# Patient Record
Sex: Female | Born: 1980 | Race: Black or African American | Hispanic: No | Marital: Married | Smoking: Never smoker
Health system: Southern US, Community
[De-identification: ages and names within clinical notes are randomized; demographics above are authoritative.]

## PROBLEM LIST (undated history)

## (undated) ENCOUNTER — Emergency Department (HOSPITAL_COMMUNITY): Payer: Federal, State, Local not specified - PPO

## (undated) DIAGNOSIS — R011 Cardiac murmur, unspecified: Secondary | ICD-10-CM

## (undated) DIAGNOSIS — N83209 Unspecified ovarian cyst, unspecified side: Secondary | ICD-10-CM

## (undated) DIAGNOSIS — N970 Female infertility associated with anovulation: Secondary | ICD-10-CM

## (undated) DIAGNOSIS — D571 Sickle-cell disease without crisis: Secondary | ICD-10-CM

## (undated) DIAGNOSIS — R17 Unspecified jaundice: Secondary | ICD-10-CM

## (undated) DIAGNOSIS — D649 Anemia, unspecified: Secondary | ICD-10-CM

## (undated) HISTORY — DX: Sickle-cell disease without crisis: D57.1

## (undated) HISTORY — DX: Anemia, unspecified: D64.9

## (undated) HISTORY — DX: Unspecified ovarian cyst, unspecified side: N83.209

## (undated) HISTORY — DX: Female infertility associated with anovulation: N97.0

## (undated) HISTORY — DX: Cardiac murmur, unspecified: R01.1

## (undated) HISTORY — DX: Unspecified jaundice: R17

---
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
## 2005-03-05 ENCOUNTER — Emergency Department (HOSPITAL_COMMUNITY): Admission: EM | Admit: 2005-03-05 | Discharge: 2005-03-05 | Payer: Self-pay | Admitting: Emergency Medicine

## 2005-03-07 ENCOUNTER — Inpatient Hospital Stay (HOSPITAL_COMMUNITY): Admission: EM | Admit: 2005-03-07 | Discharge: 2005-03-23 | Payer: Self-pay | Admitting: Emergency Medicine

## 2005-03-09 ENCOUNTER — Ambulatory Visit: Payer: Self-pay | Admitting: Oncology

## 2005-03-17 ENCOUNTER — Ambulatory Visit: Payer: Self-pay | Admitting: Cardiology

## 2005-03-17 ENCOUNTER — Encounter: Payer: Self-pay | Admitting: Cardiology

## 2005-03-20 ENCOUNTER — Ambulatory Visit: Payer: Self-pay | Admitting: Oncology

## 2005-03-22 ENCOUNTER — Encounter: Payer: Self-pay | Admitting: *Deleted

## 2005-05-26 ENCOUNTER — Ambulatory Visit: Payer: Self-pay | Admitting: Oncology

## 2006-02-06 ENCOUNTER — Emergency Department (HOSPITAL_COMMUNITY): Admission: EM | Admit: 2006-02-06 | Discharge: 2006-02-06 | Payer: Self-pay | Admitting: Emergency Medicine

## 2006-02-28 ENCOUNTER — Emergency Department (HOSPITAL_COMMUNITY): Admission: EM | Admit: 2006-02-28 | Discharge: 2006-02-28 | Payer: Self-pay | Admitting: Emergency Medicine

## 2006-04-14 ENCOUNTER — Emergency Department (HOSPITAL_COMMUNITY): Admission: EM | Admit: 2006-04-14 | Discharge: 2006-04-14 | Payer: Self-pay | Admitting: Emergency Medicine

## 2007-02-06 IMAGING — CR DG CHEST 2V
2 series · 2 of 2 positions shown · non-contrast
Comparison: none

CLINICAL DATA: Shortness of breath and cough.  Sickle cell.
 CHEST ? 2 VIEW:

[w chest pa]
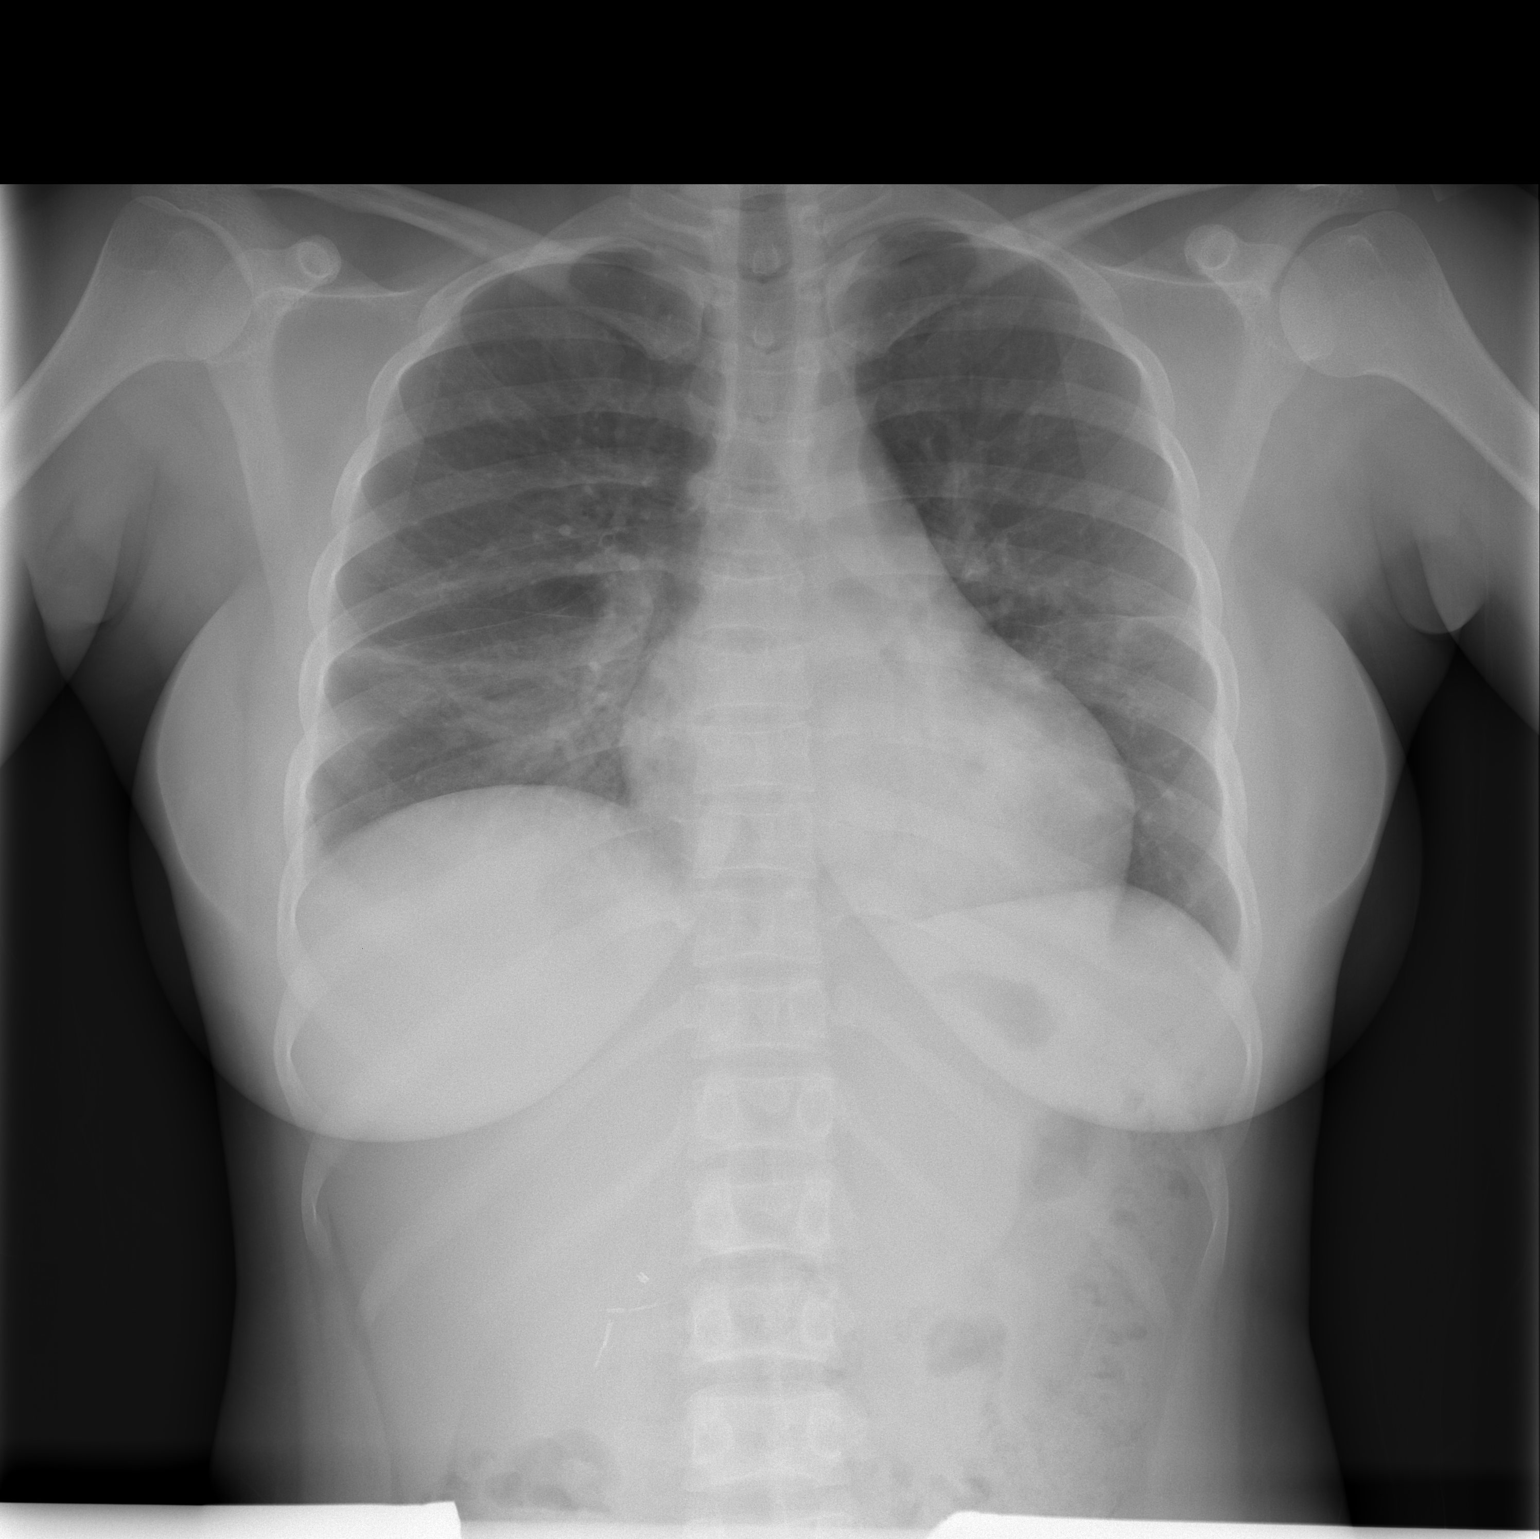

[w chest lat]
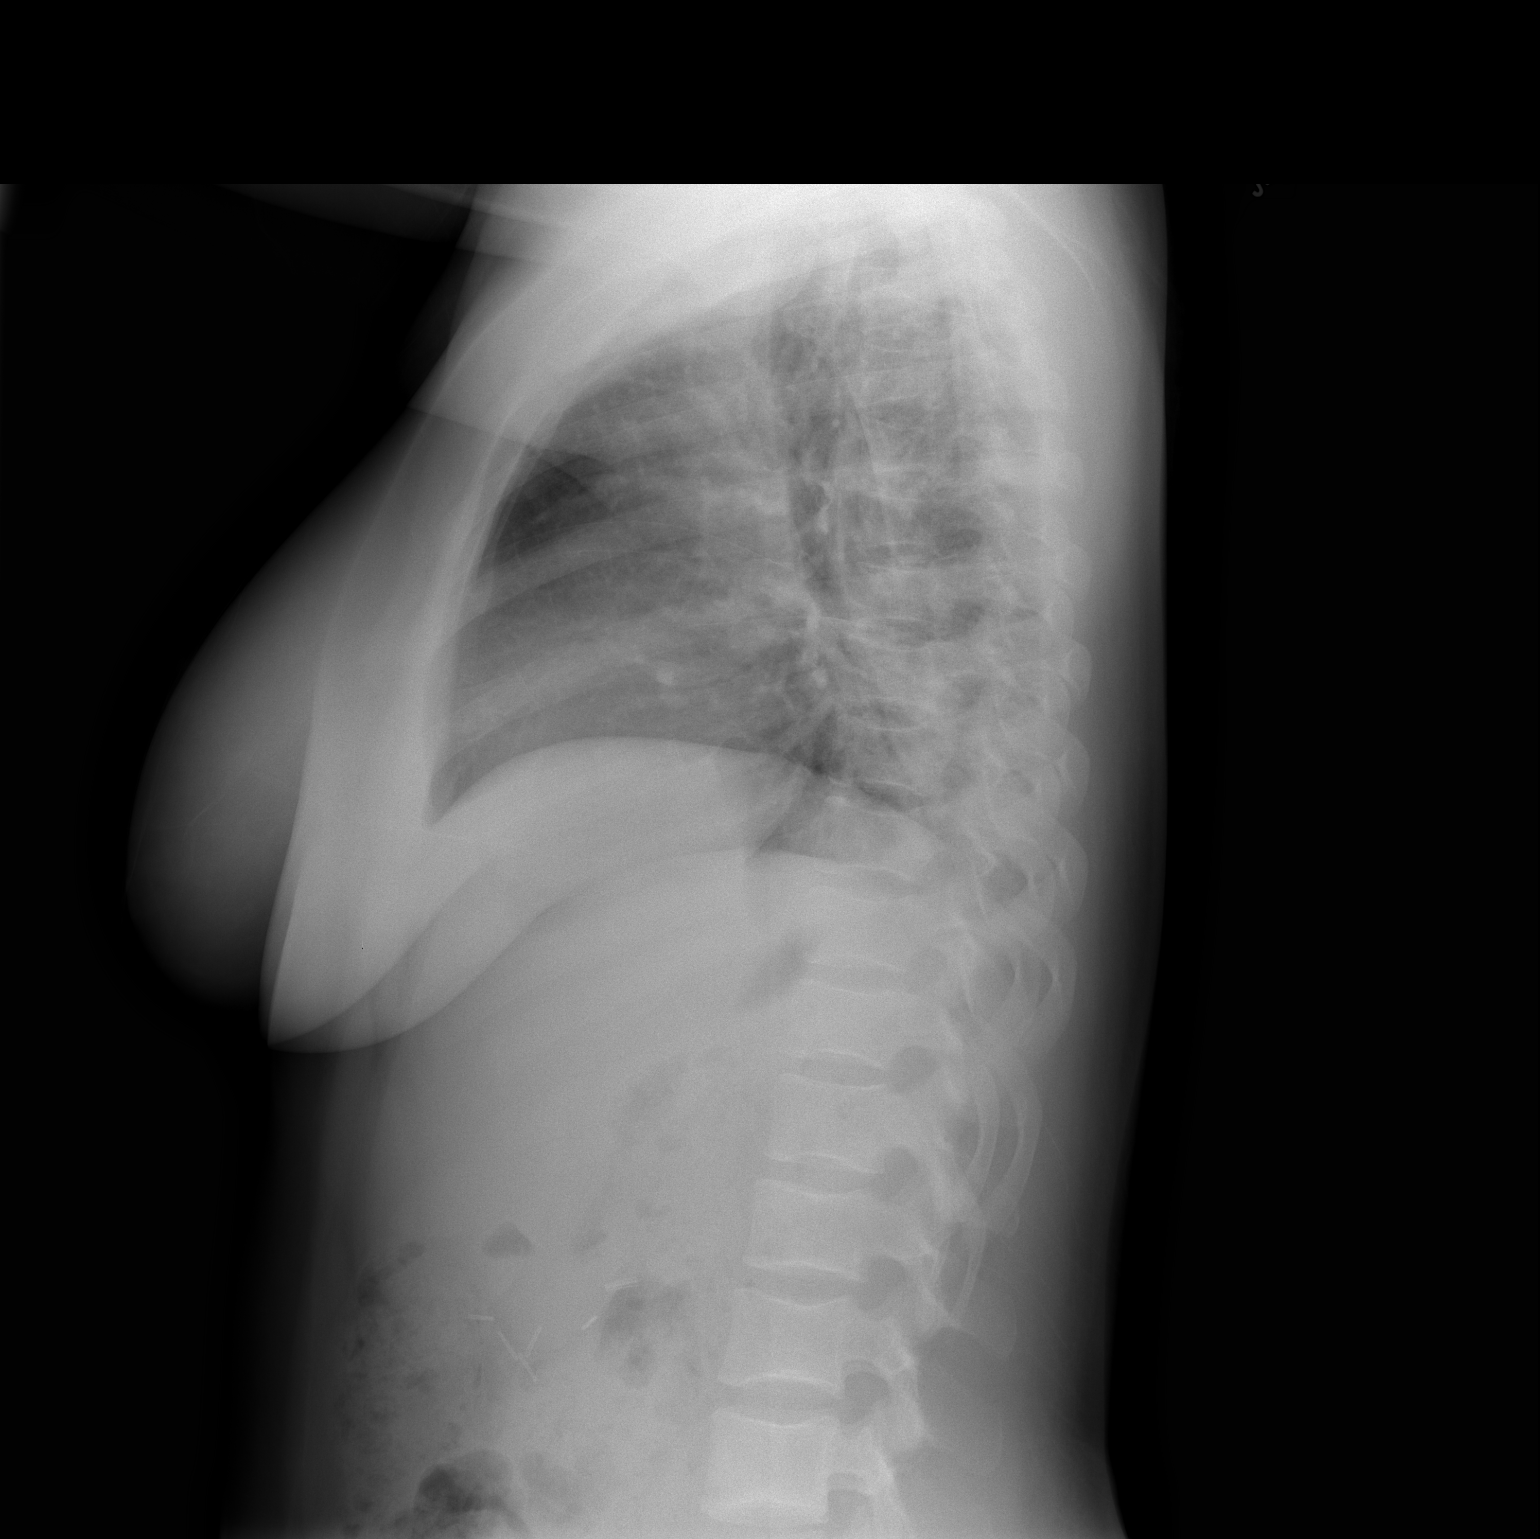

[2 of 2 positions shown; findings below may reference images not displayed]

FINDINGS: PA and lateral views reveal the heart size to be prominent particularly in the region of the left ventricle.  Areas of infiltration noted in the right lower lobe.  As well as probable left lower lobe.  
 There is pneumonia in the right lower lobe and left lower lobe.
IMPRESSION:

## 2007-02-06 IMAGING — CT CT ANGIO CHEST
2 of 4 series · 19 of 36 positions shown · IV contrast (omnipaque)
Comparison: None.

CLINICAL DATA: Short of breath/cough.
 CT ANGIOGRAPHY OF CHEST:
TECHNIQUE: Multi-detector CT imaging of the chest was performed before and during bolus injection of intravenous contrast.  Multi-planar CT image reconstructions were generated to evaluate the vascular anatomy.
 Contrast:  80 cc Omnipaque 300.

[Series 6: pe 1.0 b20f st · axial · 0.60mm/px · z∈[-205,-29]mm · 16 of 196 slices shown]
[im 10/196  lung]
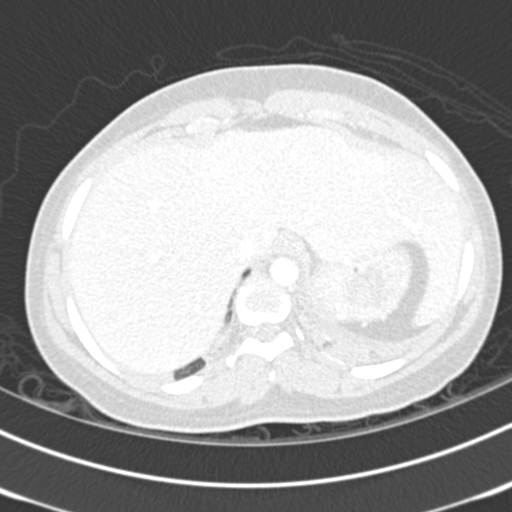
[im 20/196  mediastinal]
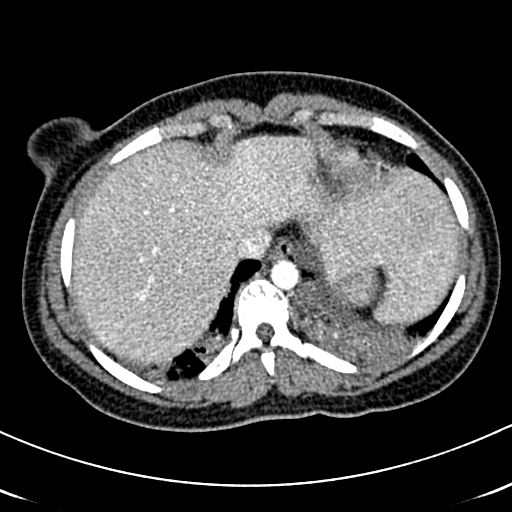
[im 30/196  lung]
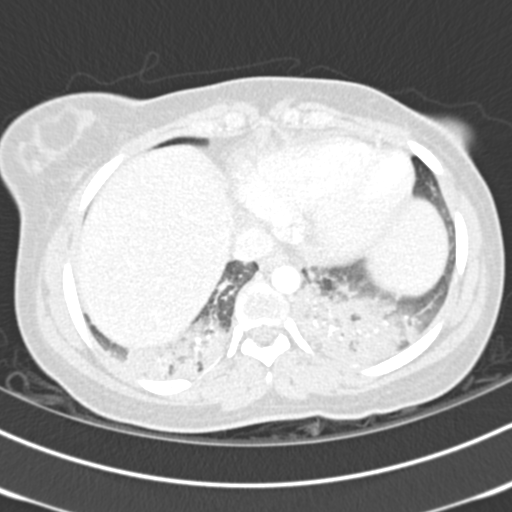
[im 49/196  mediastinal]
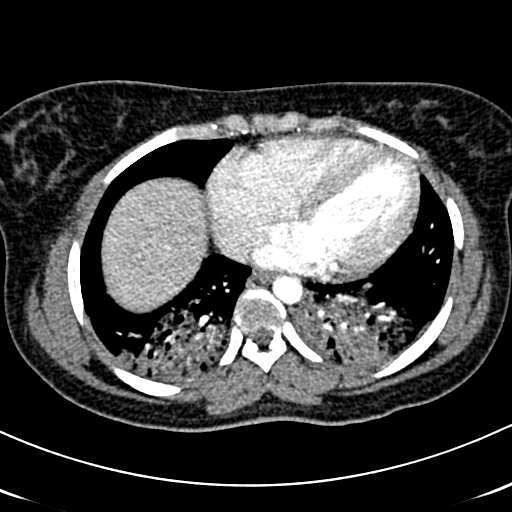
[im 59/196  lung]
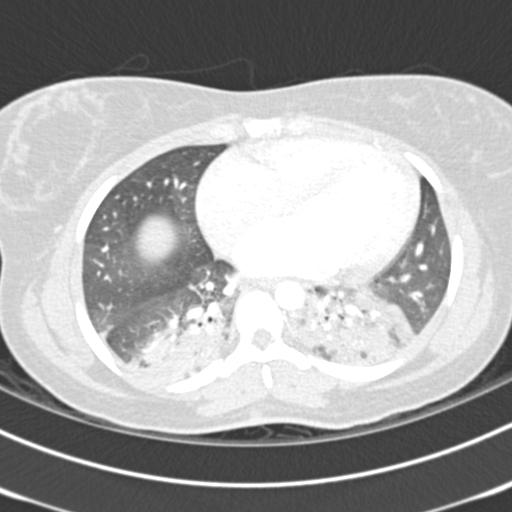
[im 69/196  mediastinal]
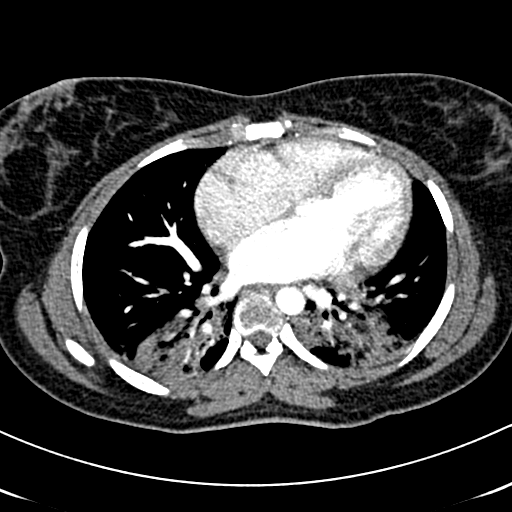
[im 79/196  lung]
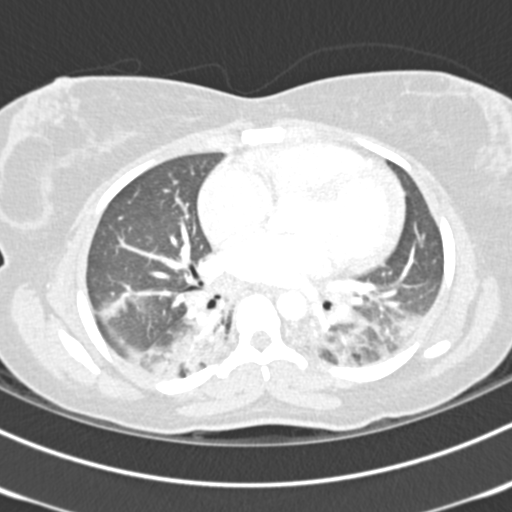
[im 88/196  mediastinal]
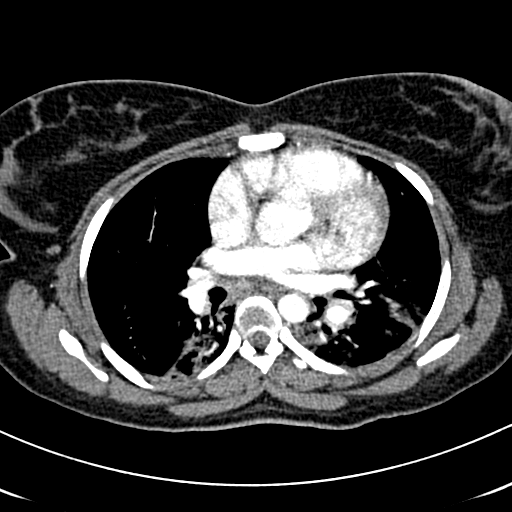
[im 108/196  lung]
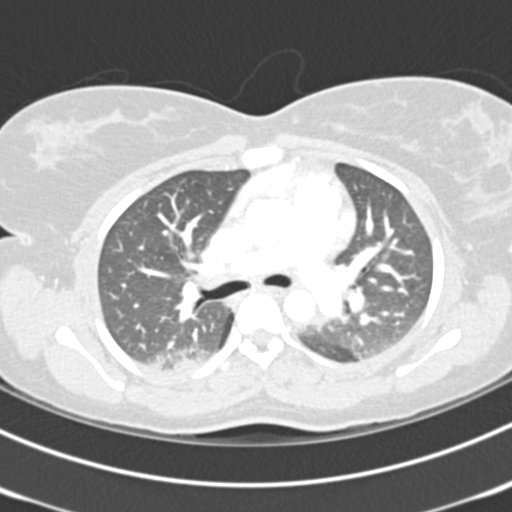
[im 118/196  mediastinal]
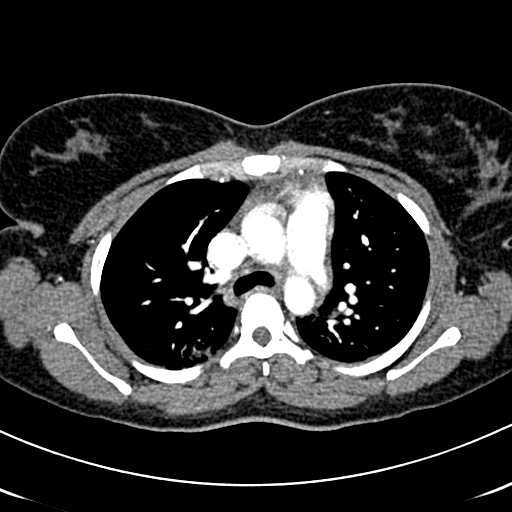
[im 127/196  lung]
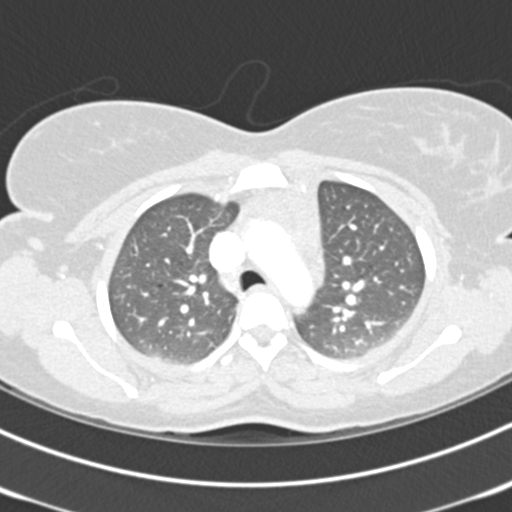
[im 137/196  mediastinal]
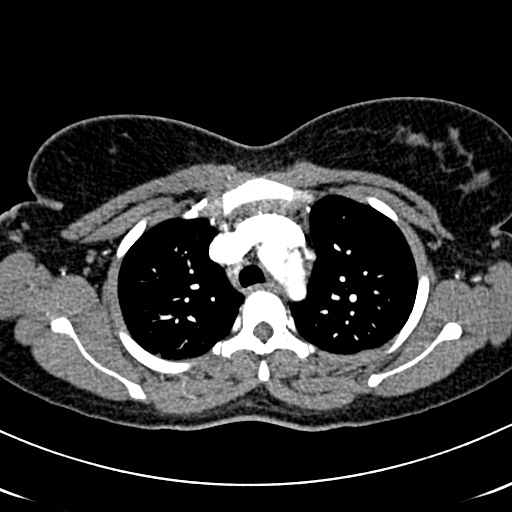
[im 147/196  lung]
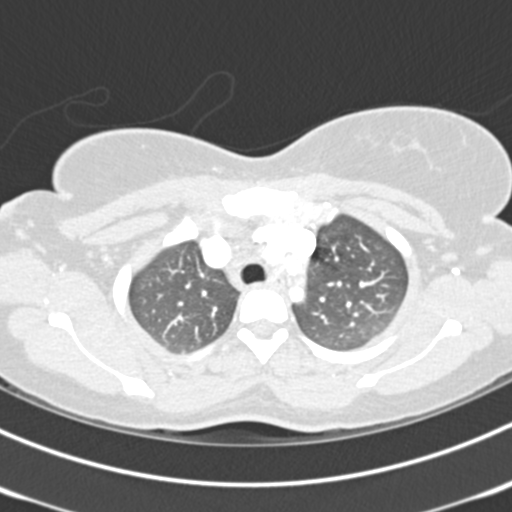
[im 166/196  mediastinal]
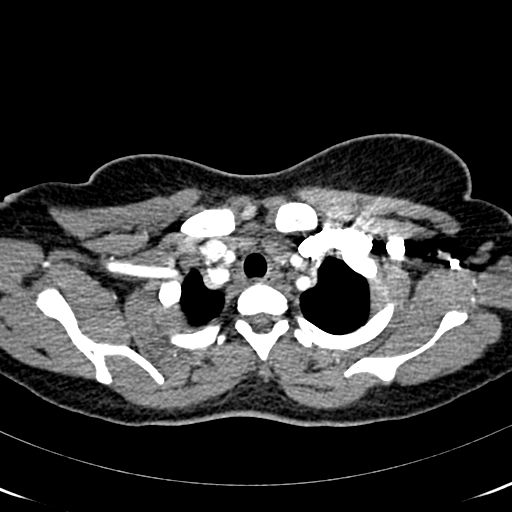
[im 176/196  lung]
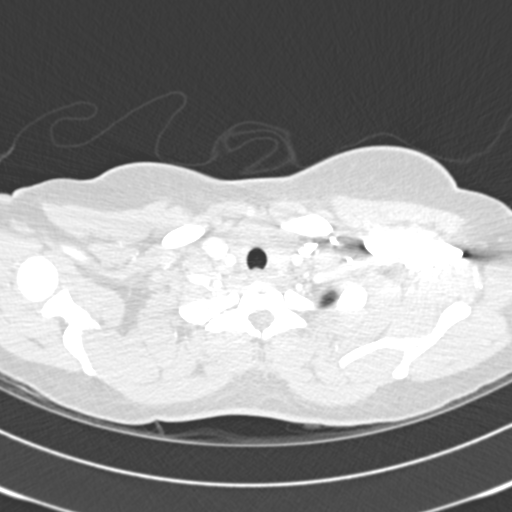
[im 186/196  mediastinal]
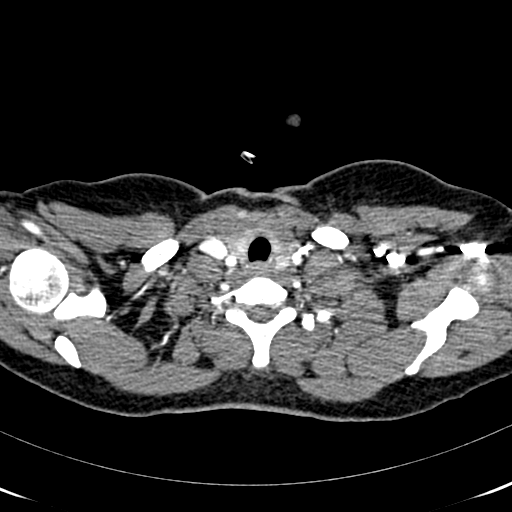

[Series 602: coronal · coronal · 0.60mm/px · 3 of 39 slices shown]
[im 8/39  mediastinal]
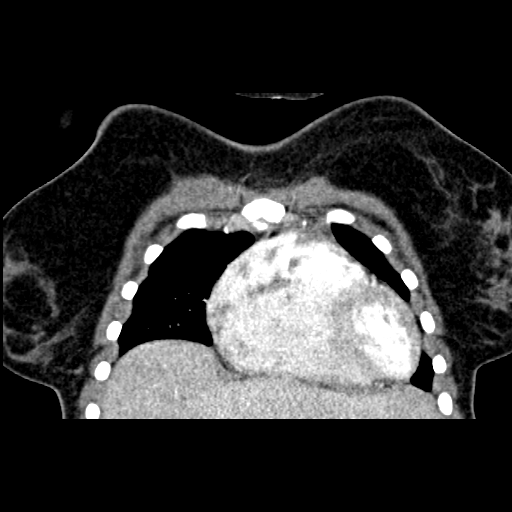
[im 16/39  mediastinal]
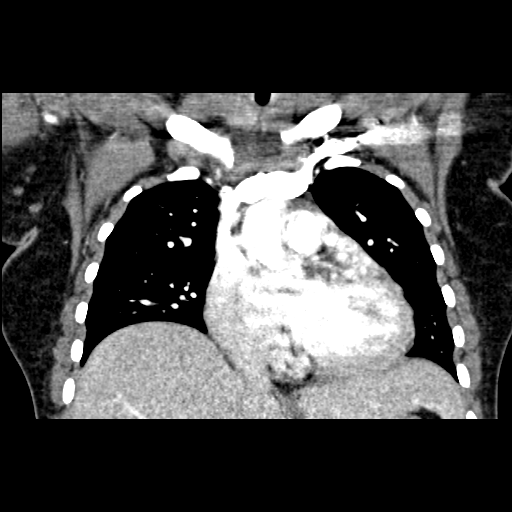
[im 23/39  mediastinal]
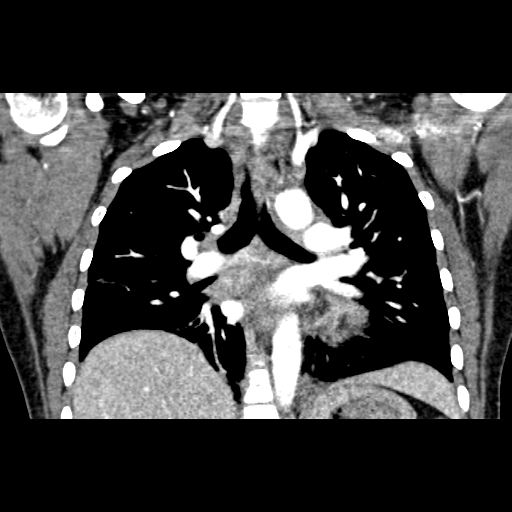

[19 of 36 positions shown; findings below may reference images not displayed]

FINDINGS: In three planes, no pulmonary emboli are identified.  The bilateral lower lobe vessels are somewhat attenuated by extensive airspace infiltrates.  This compromises the study to some degree but I see no changes suspicious for pulmonary emboli.  There is no significant amount of pleural fluid noted at this time. No pericardial fluid or mediastinal adenopathy.
 Thoracic aorta normal.
IMPRESSION: 1.  Extensive bilateral lower lobe pneumonia.
 2.  No pulmonary emboli or dissection.

## 2007-02-08 ENCOUNTER — Emergency Department (HOSPITAL_COMMUNITY): Admission: EM | Admit: 2007-02-08 | Discharge: 2007-02-09 | Payer: Self-pay | Admitting: Emergency Medicine

## 2007-03-25 ENCOUNTER — Emergency Department (HOSPITAL_COMMUNITY): Admission: EM | Admit: 2007-03-25 | Discharge: 2007-03-25 | Payer: Self-pay | Admitting: Emergency Medicine

## 2007-03-26 ENCOUNTER — Emergency Department (HOSPITAL_COMMUNITY): Admission: EM | Admit: 2007-03-26 | Discharge: 2007-03-26 | Payer: Self-pay | Admitting: Emergency Medicine

## 2007-06-23 ENCOUNTER — Emergency Department (HOSPITAL_COMMUNITY): Admission: EM | Admit: 2007-06-23 | Discharge: 2007-06-23 | Payer: Self-pay | Admitting: Emergency Medicine

## 2010-01-25 ENCOUNTER — Observation Stay (HOSPITAL_COMMUNITY)
Admission: EM | Admit: 2010-01-25 | Discharge: 2010-01-26 | Payer: Self-pay | Source: Home / Self Care | Admitting: Emergency Medicine

## 2010-01-25 ENCOUNTER — Ambulatory Visit: Payer: Self-pay | Admitting: Vascular Surgery

## 2010-01-25 ENCOUNTER — Encounter (INDEPENDENT_AMBULATORY_CARE_PROVIDER_SITE_OTHER): Payer: Self-pay | Admitting: Emergency Medicine

## 2010-01-27 ENCOUNTER — Inpatient Hospital Stay (HOSPITAL_COMMUNITY)
Admission: EM | Admit: 2010-01-27 | Discharge: 2010-02-01 | Payer: Self-pay | Source: Home / Self Care | Admitting: Emergency Medicine

## 2010-01-28 ENCOUNTER — Encounter (INDEPENDENT_AMBULATORY_CARE_PROVIDER_SITE_OTHER): Payer: Self-pay | Admitting: Internal Medicine

## 2010-01-29 ENCOUNTER — Encounter (INDEPENDENT_AMBULATORY_CARE_PROVIDER_SITE_OTHER): Payer: Self-pay | Admitting: Internal Medicine

## 2010-06-12 ENCOUNTER — Inpatient Hospital Stay (HOSPITAL_COMMUNITY)
Admission: EM | Admit: 2010-06-12 | Discharge: 2010-06-15 | DRG: 812 | Discharge status: Against Medical Advice | Payer: PRIVATE HEALTH INSURANCE | Attending: Internal Medicine | Admitting: Internal Medicine

## 2010-06-12 ENCOUNTER — Emergency Department (HOSPITAL_COMMUNITY): Payer: PRIVATE HEALTH INSURANCE

## 2010-06-12 DIAGNOSIS — K5900 Constipation, unspecified: Secondary | ICD-10-CM | POA: Diagnosis present

## 2010-06-12 DIAGNOSIS — D57819 Other sickle-cell disorders with crisis, unspecified: Secondary | ICD-10-CM | POA: Diagnosis present

## 2010-06-12 DIAGNOSIS — J209 Acute bronchitis, unspecified: Secondary | ICD-10-CM | POA: Diagnosis present

## 2010-06-12 DIAGNOSIS — Z5320 Procedure and treatment not carried out because of patient's decision for unspecified reasons: Secondary | ICD-10-CM

## 2010-06-12 DIAGNOSIS — R339 Retention of urine, unspecified: Secondary | ICD-10-CM | POA: Diagnosis present

## 2010-06-12 DIAGNOSIS — D5700 Hb-SS disease with crisis, unspecified: Principal | ICD-10-CM | POA: Diagnosis present

## 2010-06-12 DIAGNOSIS — D5701 Hb-SS disease with acute chest syndrome: Secondary | ICD-10-CM | POA: Diagnosis not present

## 2010-06-12 LAB — DIFFERENTIAL
Basophils Absolute: 0.2 10*3/uL — ABNORMAL HIGH (ref 0.0–0.1)
Lymphs Abs: 2.7 10*3/uL (ref 0.7–4.0)
Monocytes Absolute: 1.1 10*3/uL — ABNORMAL HIGH (ref 0.1–1.0)
Monocytes Relative: 7 % (ref 3–12)
Neutrophils Relative %: 74 % (ref 43–77)

## 2010-06-12 LAB — RETICULOCYTES
RBC.: 2.75 MIL/uL — ABNORMAL LOW (ref 3.87–5.11)
Retic Count, Absolute: 503.3 10*3/uL — ABNORMAL HIGH (ref 19.0–186.0)
Retic Ct Pct: 18.3 % — ABNORMAL HIGH (ref 0.4–3.1)

## 2010-06-12 LAB — BASIC METABOLIC PANEL
BUN: 6 mg/dL (ref 6–23)
Calcium: 8.8 mg/dL (ref 8.4–10.5)
Creatinine, Ser: 0.45 mg/dL (ref 0.4–1.2)
GFR calc non Af Amer: >60 mL/min (ref >60)
Glucose, Bld: 87 mg/dL (ref 70–99)

## 2010-06-12 LAB — POCT CARDIAC MARKERS
CKMB, poc: <1 ng/mL — ABNORMAL LOW (ref 1.0–8.0)
Myoglobin, poc: 37 ng/mL (ref 12–200)

## 2010-06-12 LAB — CBC
MCHC: 34.3 g/dL (ref 30.0–36.0)
RDW: 20.9 % — ABNORMAL HIGH (ref 11.5–15.5)
WBC: 15.6 10*3/uL — ABNORMAL HIGH (ref 4.0–10.5)

## 2010-06-13 LAB — COMPREHENSIVE METABOLIC PANEL
Albumin: 3.8 g/dL (ref 3.5–5.2)
BUN: 2 mg/dL — ABNORMAL LOW (ref 6–23)
Calcium: 8.7 mg/dL (ref 8.4–10.5)
Chloride: 102 mEq/L (ref 96–112)
Creatinine, Ser: 0.43 mg/dL (ref 0.4–1.2)
Total Bilirubin: 6.7 mg/dL — ABNORMAL HIGH (ref 0.3–1.2)
Total Protein: 7.3 g/dL (ref 6.0–8.3)

## 2010-06-13 LAB — CBC
HCT: 23.9 % — ABNORMAL LOW (ref 36.0–46.0)
Hemoglobin: 8.6 g/dL — ABNORMAL LOW (ref 12.0–15.0)
MCH: 36 pg — ABNORMAL HIGH (ref 26.0–34.0)
RBC: 2.39 MIL/uL — ABNORMAL LOW (ref 3.87–5.11)

## 2010-06-14 ENCOUNTER — Inpatient Hospital Stay (HOSPITAL_COMMUNITY): Payer: PRIVATE HEALTH INSURANCE

## 2010-06-14 LAB — URINALYSIS, ROUTINE W REFLEX MICROSCOPIC
Ketones, ur: NEGATIVE mg/dL
Nitrite: NEGATIVE
Protein, ur: NEGATIVE mg/dL
Urobilinogen, UA: 1 mg/dL (ref 0.0–1.0)

## 2010-06-14 LAB — CBC
HCT: 22.6 % — ABNORMAL LOW (ref 36.0–46.0)
MCV: 100.9 fL — ABNORMAL HIGH (ref 78.0–100.0)
RDW: 20.7 % — ABNORMAL HIGH (ref 11.5–15.5)
WBC: 10.5 10*3/uL (ref 4.0–10.5)

## 2010-06-14 LAB — COMPREHENSIVE METABOLIC PANEL
Alkaline Phosphatase: 83 U/L (ref 39–117)
BUN: 3 mg/dL — ABNORMAL LOW (ref 6–23)
Chloride: 108 mEq/L (ref 96–112)
Glucose, Bld: 88 mg/dL (ref 70–99)
Potassium: 4 mEq/L (ref 3.5–5.1)
Total Bilirubin: 6.2 mg/dL — ABNORMAL HIGH (ref 0.3–1.2)
Total Protein: 6.4 g/dL (ref 6.0–8.3)

## 2010-06-14 LAB — MAGNESIUM: Magnesium: 2 mg/dL (ref 1.5–2.5)

## 2010-06-15 LAB — CBC
Hemoglobin: 7.7 g/dL — ABNORMAL LOW (ref 12.0–15.0)
MCH: 34.2 pg — ABNORMAL HIGH (ref 26.0–34.0)
Platelets: 352 10*3/uL (ref 150–400)
RBC: 2.25 MIL/uL — ABNORMAL LOW (ref 3.87–5.11)
WBC: 14.8 10*3/uL — ABNORMAL HIGH (ref 4.0–10.5)

## 2010-06-15 LAB — RETICULOCYTES
RBC.: 2.25 MIL/uL — ABNORMAL LOW (ref 3.87–5.11)
Retic Ct Pct: 20.9 % — ABNORMAL HIGH (ref 0.4–3.1)

## 2010-06-15 LAB — COMPREHENSIVE METABOLIC PANEL
Albumin: 3.3 g/dL — ABNORMAL LOW (ref 3.5–5.2)
BUN: 3 mg/dL — ABNORMAL LOW (ref 6–23)
Creatinine, Ser: 0.35 mg/dL — ABNORMAL LOW (ref 0.4–1.2)
Total Protein: 7 g/dL (ref 6.0–8.3)

## 2010-06-15 LAB — MAGNESIUM: Magnesium: 2.1 mg/dL (ref 1.5–2.5)

## 2010-06-16 LAB — URINE CULTURE
Colony Count: NO GROWTH
Culture  Setup Time: 201203100141
Culture: NO GROWTH

## 2010-06-19 LAB — URINALYSIS, ROUTINE W REFLEX MICROSCOPIC
Bilirubin Urine: NEGATIVE
Bilirubin Urine: NEGATIVE
Glucose, UA: NEGATIVE mg/dL
Ketones, ur: NEGATIVE mg/dL
Ketones, ur: NEGATIVE mg/dL
Nitrite: NEGATIVE
Nitrite: NEGATIVE
Specific Gravity, Urine: 1.008 (ref 1.005–1.030)
Urobilinogen, UA: 1 mg/dL (ref 0.0–1.0)
pH: 7.5 (ref 5.0–8.0)

## 2010-06-19 LAB — POCT I-STAT, CHEM 8
Calcium, Ion: 0.98 mmol/L — ABNORMAL LOW (ref 1.12–1.32)
Chloride: 108 mEq/L (ref 96–112)
Glucose, Bld: 83 mg/dL (ref 70–99)
HCT: 33 % — ABNORMAL LOW (ref 36.0–46.0)
Hemoglobin: 11.2 g/dL — ABNORMAL LOW (ref 12.0–15.0)
Potassium: 4.7 mEq/L (ref 3.5–5.1)

## 2010-06-19 LAB — CBC
HCT: 28.6 % — ABNORMAL LOW (ref 36.0–46.0)
Hemoglobin: 7.7 g/dL — ABNORMAL LOW (ref 12.0–15.0)
Hemoglobin: 8.1 g/dL — ABNORMAL LOW (ref 12.0–15.0)
MCH: 36.2 pg — ABNORMAL HIGH (ref 26.0–34.0)
MCH: 37.7 pg — ABNORMAL HIGH (ref 26.0–34.0)
MCH: 37.7 pg — ABNORMAL HIGH (ref 26.0–34.0)
MCH: 37.9 pg — ABNORMAL HIGH (ref 26.0–34.0)
MCH: 37.9 pg — ABNORMAL HIGH (ref 26.0–34.0)
MCHC: 34.8 g/dL (ref 30.0–36.0)
MCHC: 34.9 g/dL (ref 30.0–36.0)
MCHC: 35.4 g/dL (ref 30.0–36.0)
MCHC: 36.7 g/dL — ABNORMAL HIGH (ref 30.0–36.0)
MCV: 105.9 fL — ABNORMAL HIGH (ref 78.0–100.0)
MCV: 106 fL — ABNORMAL HIGH (ref 78.0–100.0)
MCV: 106.7 fL — ABNORMAL HIGH (ref 78.0–100.0)
MCV: 107.9 fL — ABNORMAL HIGH (ref 78.0–100.0)
MCV: 98.6 fL (ref 78.0–100.0)
Platelets: 251 10*3/uL (ref 150–400)
Platelets: 297 10*3/uL (ref 150–400)
Platelets: 298 10*3/uL (ref 150–400)
Platelets: 315 10*3/uL (ref 150–400)
Platelets: 400 10*3/uL (ref 150–400)
Platelets: 418 10*3/uL — ABNORMAL HIGH (ref 150–400)
RDW: 21.2 % — ABNORMAL HIGH (ref 11.5–15.5)
RDW: 21.3 % — ABNORMAL HIGH (ref 11.5–15.5)
RDW: 23.7 % — ABNORMAL HIGH (ref 11.5–15.5)
RDW: 23.9 % — ABNORMAL HIGH (ref 11.5–15.5)
WBC: 10.9 10*3/uL — ABNORMAL HIGH (ref 4.0–10.5)
WBC: 11.1 10*3/uL — ABNORMAL HIGH (ref 4.0–10.5)
WBC: 9.8 10*3/uL (ref 4.0–10.5)

## 2010-06-19 LAB — PROTIME-INR
INR: 1.12 (ref 0.00–1.49)
Prothrombin Time: 14.6 seconds (ref 11.6–15.2)

## 2010-06-19 LAB — URINE MICROSCOPIC-ADD ON

## 2010-06-19 LAB — RETICULOCYTES
RBC.: 2.11 MIL/uL — ABNORMAL LOW (ref 3.87–5.11)
RBC.: 2.64 MIL/uL — ABNORMAL LOW (ref 3.87–5.11)
Retic Count, Absolute: 151.9 10*3/uL (ref 19.0–186.0)
Retic Count, Absolute: 261.1 10*3/uL — ABNORMAL HIGH (ref 19.0–186.0)
Retic Count, Absolute: 285.1 10*3/uL — ABNORMAL HIGH (ref 19.0–186.0)
Retic Ct Pct: 10.8 % — ABNORMAL HIGH (ref 0.4–3.1)
Retic Ct Pct: 12.2 % — ABNORMAL HIGH (ref 0.4–3.1)
Retic Ct Pct: 13.4 % — ABNORMAL HIGH (ref 0.4–3.1)
Retic Ct Pct: 7.2 % — ABNORMAL HIGH (ref 0.4–3.1)

## 2010-06-19 LAB — DIFFERENTIAL
Basophils Absolute: 0.1 10*3/uL (ref 0.0–0.1)
Basophils Relative: 0 % (ref 0–1)
Basophils Relative: 1 % (ref 0–1)
Eosinophils Absolute: 0.1 10*3/uL (ref 0.0–0.7)
Eosinophils Relative: 0 % (ref 0–5)
Eosinophils Relative: 1 % (ref 0–5)
Lymphocytes Relative: 18 % (ref 12–46)
Lymphs Abs: 3.1 10*3/uL (ref 0.7–4.0)
Monocytes Absolute: 1 10*3/uL (ref 0.1–1.0)
Monocytes Relative: 5 % (ref 3–12)
Monocytes Relative: 5 % (ref 3–12)
Neutro Abs: 6.8 10*3/uL (ref 1.7–7.7)
Neutrophils Relative %: 76 % (ref 43–77)
Neutrophils Relative %: 83 % — ABNORMAL HIGH (ref 43–77)

## 2010-06-19 LAB — COMPREHENSIVE METABOLIC PANEL
AST: 30 U/L (ref 0–37)
AST: 38 U/L — ABNORMAL HIGH (ref 0–37)
Albumin: 3.3 g/dL — ABNORMAL LOW (ref 3.5–5.2)
Albumin: 3.4 g/dL — ABNORMAL LOW (ref 3.5–5.2)
Albumin: 4.2 g/dL (ref 3.5–5.2)
Alkaline Phosphatase: 86 U/L (ref 39–117)
BUN: 2 mg/dL — ABNORMAL LOW (ref 6–23)
BUN: 3 mg/dL — ABNORMAL LOW (ref 6–23)
BUN: 3 mg/dL — ABNORMAL LOW (ref 6–23)
BUN: 6 mg/dL (ref 6–23)
CO2: 29 mEq/L (ref 19–32)
Calcium: 8.2 mg/dL — ABNORMAL LOW (ref 8.4–10.5)
Calcium: 8.3 mg/dL — ABNORMAL LOW (ref 8.4–10.5)
Calcium: 8.7 mg/dL (ref 8.4–10.5)
Creatinine, Ser: 0.35 mg/dL — ABNORMAL LOW (ref 0.4–1.2)
Creatinine, Ser: 0.41 mg/dL (ref 0.4–1.2)
Creatinine, Ser: 0.42 mg/dL (ref 0.4–1.2)
Creatinine, Ser: 0.45 mg/dL (ref 0.4–1.2)
GFR calc Af Amer: >60 mL/min (ref >60)
GFR calc Af Amer: >60 mL/min (ref >60)
GFR calc Af Amer: >60 mL/min (ref >60)
GFR calc non Af Amer: >60 mL/min (ref >60)
GFR calc non Af Amer: >60 mL/min (ref >60)
Potassium: 4 mEq/L (ref 3.5–5.1)
Potassium: 5.4 mEq/L — ABNORMAL HIGH (ref 3.5–5.1)
Total Protein: 6.6 g/dL (ref 6.0–8.3)
Total Protein: 7.5 g/dL (ref 6.0–8.3)

## 2010-06-19 LAB — CULTURE, BLOOD (ROUTINE X 2)
Culture  Setup Time: 201110240922
Culture  Setup Time: 201110240922
Culture: NO GROWTH

## 2010-06-19 LAB — POCT PREGNANCY, URINE: Preg Test, Ur: NEGATIVE

## 2010-06-19 LAB — PREGNANCY, URINE: Preg Test, Ur: NEGATIVE

## 2010-06-19 LAB — LACTATE DEHYDROGENASE: LDH: 449 U/L — ABNORMAL HIGH (ref 94–250)

## 2010-06-20 LAB — CULTURE, BLOOD (ROUTINE X 2)

## 2010-06-29 NOTE — H&P (Signed)
[name], [name]            ACCOUNT NO.:  [id number]  MEDICAL RECORD NO.:  [id number]           PATIENT TYPE:  E  LOCATION:  WLED                         FACILITY:  [facility]  PHYSICIAN:  Calvert Cantor, M.D.     DATE OF BIRTH:  1980/07/08  DATE OF ADMISSION:  06/12/2010 DATE OF DISCHARGE:                             HISTORY & PHYSICAL   PRIMARY CARE PHYSICIAN:  Ruel Favors, MD at [facility].  PRESENTING COMPLAINT:  Chest pain and back pain.  HISTORY OF PRESENT ILLNESS:  This is a 30 year old female with sickle cell anemia who woke up at 5 a.m. with pain in the center of her chest and across her lower back.  She took the Dilaudid that she has at home, but pain was not relieved.  She came into the ER and was given multiple doses of IV Dilaudid without pain relief.  She is now on a Dilaudid PCA.  The patient has had a cough productive of yellowish sputum for about a week now.  She had a chest x-ray performed yesterday by her PCP and was told that it was normal.  She states her cough is somewhat better.  She has not had any fevers.  She does have a sore throat.  PAST MEDICAL HISTORY: 1. Sickle cell anemia. 2. DVT in her right arm secondary to a PICC line. 3. Pneumonia.  PAST SURGICAL HISTORY:  Laparoscopic cholecystectomy.  SOCIAL HISTORY:  She does not smoke, drink, or use any drugs.  She is a Consulting civil engineer at [employer].  She is not married and has no children.  ALLERGIES:  No known drug allergies.  HOME MEDICATIONS:  According to the medication reconciliation sheet, she is on the following: 1. Oxycodone 5 mg 2 tablets q.6 h. as needed. 2. Hydroxyurea 500 mg 2 tablets daily. 3. Folic acid 1 mg daily. 4. Hydromorphone 2 mg 1 tablet q.4 h. as needed.  REVIEW OF SYSTEMS:  GENERAL:  No recent weight gain or weight loss.  No fever, chills, or sweats.  No frequent headaches.  No blurred vision or double vision.  She has a sore throat.  No sinus trouble or earache. RESPIRATORY:   Positive for cough and yellow sputum as mentioned in H and P.  No wheezing.  CARDIAC:  Pain in the center of her chest.  No palpitations.  No pedal edema.  GI:  She has had nausea and vomiting 2 to 3 times today in the ER.  No abdominal pain.  No diarrhea or constipation.  GU: No dysuria or hematuria.  She last urinated around 5 o'clock this morning and it has been a little over 12 hours.  She feels like her bladder is quite distended.  She has gotten up to the commode multiple times, but is not able to urinate.  HEMATOLOGICAL:  No easy bruising.  SKIN:  No rash.  MUSCULOSKELETAL:  Pain across her lower back and center of her chest.  NEUROLOGICAL:  No history of seizure or syncope.  No focal numbness or weakness.  PSYCHOLOGICAL:  No anxiety or depression.  PHYSICAL EXAMINATION:  GENERAL:  Young female, lying in bed, in no  acute distress, somewhat sleepy. VITAL SIGNS:  Blood pressure 119/78, pulse 94, respiratory rate 20, temperature 97.6, and pulse ox 96%. HEENT:  Pupils equal, round, and reactive to light.  Extraocular movements are intact.  Conjunctivae are pink.  She does have scleral icterus.  Oral mucosa is dry.  Oropharynx is clear. NECK:  Supple.  No thyromegaly, lymphadenopathy, or carotid bruits. HEART:  Regular rate and rhythm.  No murmurs, rubs, or gallops. LUNGS:  Clear bilaterally.  Normal respiratory effort.  No use of accessory muscles. ABDOMEN:  Soft, slightly tender in suprapubic area, bowel sounds positive, and nondistended. EXTREMITIES:  No cyanosis, clubbing, or edema.  Pedal pulses positive. NEUROLOGICAL:  Cranial nerves II through XII intact.  Strength intact in all 4 extremities. PSYCHOLOGICAL:  Awake, alert, and oriented x3.  Mood and affect normal. SKIN:  Warm and dry.  No rash or bruising.  LABORATORY DATA:  Chest x-ray does not reveal any cardiopulmonary disease.  There are bony changes of sickle cell disease noted in the spine.  Blood work reveals a WBC  count of 15.6, hemoglobin is 9.6, hematocrit is 28, MCV is 101.8.  Metabolic panel is within normal limits.  Retic count is 18.3.  X-ray of her lumbar spine reveals minimal anterior spur formation at L3-L4 level.  No acute abnormality.  EKG reveals sinus rhythm at 88 beats per minute.  ASSESSMENT AND PLAN: 1. Sickle cell crisis with jaundice.  She is on a Dilaudid PCA and is     feeling better.  We will continue her on oxygen and IV fluids. 2. Urinary retention likely secondary to narcotics.  I will request an     in and out cath.  The patient does want to hold off on a Foley.  We     will see if she is unable to urinate, she may need a Foley     catheter. 3. Cough with yellow sputum likely secondary to acute bronchitis.  She     states it is improving, so I will monitor for now. 4. Vomiting.  This has resolved.  She is actually requesting a regular     diet.  We will see if she can tolerate this. 5. Leukocytosis possibly stress response versus secondary to her     bronchitis.  Again, we will monitor.  Recheck tomorrow. 6. Deep venous thrombosis prophylaxis with Lovenox.  TIME ON ADMISSION:  55 minutes.     Calvert Cantor, M.D.     SR/MEDQ  D:  06/12/2010  T:  06/12/2010  Job:  [id number]  Electronically Signed by Calvert Cantor M.D. on 06/29/2010 04:58:02 PM

## 2010-08-23 NOTE — Discharge Summary (Signed)
[name], [name]            ACCOUNT NO.:  [id number]   MEDICAL RECORD NO.:  [id number]          PATIENT TYPE:  INP   LOCATION:  3316                         FACILITY:  [facility]   PHYSICIAN:  [name], M.D.DATE OF BIRTH:  02/05/1981   DATE OF ADMISSION:  03/15/2005  DATE OF DISCHARGE:  03/23/2005                                 DISCHARGE SUMMARY   DATE OF TRANSFER:  Probably March 23, 2005.   CONSULTATIONS:  Hematology:  Tara Churn. Cyndie Mayo, M.D.   FINAL DIAGNOSES:  1.  Delayed hemolytic blood transfusion reaction.  2.  Sickle cell crisis.  3.  Community-acquired pneumonia.  4.  Acute chest syndrome.  5.  Reactive thrombocytosis.  6.  Leukocytosis, white cell count 20,000, reactive leukocytosis versus      infectious etiology.  Workup is ongoing.   REASON FOR TRANSFER:  Delayed hemolytic blood transfusion reaction with  potential for rapid deterioration.   PROCEDURES:  1.  Chest x-ray, done on March 11, 2005, showed right and left lower lobe      pneumonia.  2.  CT scan of the chest, done on March 07, 2005, showed bilateral lower      lobe air space opacity compatible with bilateral lower lobe pneumonia.      There was no pulmonary emboli or dissection.  3.  Ultrasound of the abdomen, done on March 11, 2005, showed bilateral      pleural effusions, common bile duct is normal in size without evidence      of biliary ductal dilatation.  4.  CT angiogram of the chest repeated, on March 14, 2005, showed no      pulmonary embolism.  There was persistent extensive bilateral air space      disease most pronounced in the lower lobes which has now involved all      other lobes essentially progressed from previous CT.  5.  She had tunnelled central venous catheter placed on March 15, 2005.  6.  Exchange blood transfusion on March 16, 2005.  7.  The central venous catheter was removed on March 18, 2005.  8.  Chest x-ray done, on March 19, 2005,  showed continued low volumes      with bilateral lower lobe air space opacity which is essentially      unchanged.  9.  A 2D echocardiogram done, on March 17, 2005, which showed normal left      ventricular systolic function, left ventricular ejection fraction      estimated to be 55-65%.  Doppler parameters are consistent with abnormal      left ventricular relaxation.   BRIEF HISTORY:  [name] is a pleasant 30 year old African lady with a  history of sickle cell anemia.  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  She presented to the emergency department, on  March 07, 2005, with approximately seven-day history of cough productive  of yellow sputum, fever, and chest pain with shortness of breath.  She was  evaluated in the emergency  room with a chest x-ray which revealed pneumonia  in the right lower lobe and left lobe.  Her vitals in the emergency  department were temperature of 101, pulse 110, respiratory rate of 24, blood  pressure of 153/76.  Oxygen saturation was 89% on room air.  She was further  evaluated with a CAT scan of the chest which was negative for pulmonary  embolism but did show bilateral lower lobe air space disease, most  consistent with pneumonia.  [name] was then admitted for IV  antibiotics.  In addition, she described symptoms that were consistent with  the usual sickle cell crisis which involved her chest, rib, back, and  extremities.  She was also started on IV fluid and continued on her folic  acid.  Please refer to the attached H&P for full details.   HOSPITAL COURSE:  [name] was started broad spectrum antibiotics  which included Rocephin, Zithromax, and vancomycin.  She was pancultured.  A  sputum culture grew normal oropharyngeal flora.  Blood culture to date have  been negative.  Legionella antigen was negative.  Her urine  culture was also  insignificant.  Microplasma antibody was negative.   She had persistent fever.  On March 09, 2005, she deteriorated and her  oxygen saturation was 50% on 5 liters.  A repeat chest x-ray was obtained  and this showed worsening bilateral air space disease.  Consideration was  then given to acute chest syndrome.  She was seen by hematologist.  He  agreed with continuation of conservative management including oxygen  supplementation and IV antibiotics and she was watched over the next 24  hours.  There was no clear improvement in her respiratory symptomatology.  Hence, the decision was made to offer exchange blood transfusion.  Prior to  that, she had dropped her hemoglobin from admission hemoglobin of 7.8 to  6.7.  Hence, it was felt that she should be transfused with packed red blood  cells.  She was transfused with two units of packed red blood cells.   Her case was discussed with the patient's hematologist in [place], Dr.  Alyce Pagan, who concurred with our hematologist here regarding exchange  blood transfusion.  At this point, the patient had a hemoglobin S level of  56%, F 5.5, and AZ of 3.8.  She had exchange blood transfusion on March 16, 2005.  She did well post exchange blood transfusion for about four days.  She was pain free.  Subsequently, she developed low back pain and dropping  of hemoglobin from 9.7 to 6.3.  Her pain medications were optimized and also  given two units of packed red blood cells.  The patient's pain resolved in  24 hours and her hematocrit improved to 8.1.  She subsequently became more  jaundiced.  Bilirubin increased from 4.6 to 10.3 at the time of this  dictation.  LDH has also increased from 358 to 546.  Her hemoglobin today is  6.6 with hematocrit of 19.1.  Her antibody screen was positive.  Direct  antiglobulin, IgG, and complement were negative.  It is felt that she is having a delayed post transfusion hemolytic reaction.   The blood smear was  reviewed.  She has elevated nucleated cells.  It addition, her white cell  count today is 20.3 which is a sudden jump from 6.7 yesterday.  She has  bands greater than 20%.  She has reactive thrombocytosis with platelets of  780.  I discussed this case with the  patient's hematologist over at  [facility], Dr. Alyce Pagan.  Decision was made to start her on IV Ig  with Solu-Medrol.  These have been commenced at this point.   She has leukocytosis in the 20,000 with bandemia which raises concern for  infectious etiology versus reactive leukocytosis.  Given the fact that the  patient will be started on IV steroids, she was empirically covered with  broad spectrum antibiotics vancomycin and Zosyn, pending the results of  panculture.  She has had two sets of blood cultures.  A PICC line has been  removed and tip sent for culture.  Her urinalysis appears insignificant for  UTI.  She has a low-grade fever of 99.1.  In any case, she will continue on  IV antibiotics until culture results are available.  I would mention that  malaria smear was also obtained and preliminary result is negative.   LABORATORY DATA:  At the time of this dictation, March 22, 2005, LDL 546.  Sodium 136, potassium 4.1, chloride 109, bicarb 23, glucose 100, BUN 6,  creatinine 0.6.  Bilirubin 10.5, alkaline phosphatase 68, AST 42, ALT 20,  total protein 6.6, albumin 3.0, calcium 8.6.  Reticulocyte count percentage  19.9.  Absolute retic count 382.1.  _________ retic 55.9.  White cells 20.3,  hemoglobin 6.6, hematocrit 19.1, MCV 98.4, platelets 780, neutrophils 82,  lymphocytes 12, monocytes 5%, eosinophil 1%, bands greater than 20%.  Morphology showed marked polychromasia, marked poikilocytosis, large  platelets were noted.  Direct antiglobulin, IgG, and complements were  negative.  Antibody screen was positive.   PENDING RESULTS:  Blood culture, urine culture, peak catheter tip culture.    DISCHARGE/TRANSFER MEDICATIONS:  1.  Solu-Medrol 40 mg IV q.6h.  2.  The patient is currently receiving first dose of IV Ig 65 gram over      eight hours.  3.  She is also being prophylactically treated with Tylenol 650 mg p.o.      q.4h. for 24 hours and Benadryl 50 mg p.o. q.6h. for 24 hours.  4.  Folic acid 2 mg p.o. every day.  5.  Ibuprofen 200 mg p.o. t.i.d.  6.  Tessalon, that is benzonatate, 100 mg p.o. t.i.d.  7.  Insulin sliding scale using NovoLog q.a.c. (to cover hyperglycemia      secondary to steroid).  8.  Oxycodone 10 mg p.o. b.i.d.  9.  Protonix 40 mg p.o. every day.  10. Zosyn 3.375 gram IV q.6h.  11. Vancomycin 1 gram IV q.12h.  12. IV fluids normal saline at 125 cc/hr.  13. Senokot one tablet p.o. q.h.s.  14. Dulcolax suppository 10 mg every day p.r.n. constipation.  15. Phenergan 12.5 to 25 IV q.6h. p.r.n. nausea and vomiting.  16. Dilaudid 0.1 to 1 mg IV q.4h. p.r.n. for breakthrough pain. 17. Percocet 5/325, one to two tablets q.6h. p.r.n. for mild to moderate      breakthrough pain.  18. Lovenox 40 mg subcutaneous every day.   Please note that she was given loading dose of Solu-Medrol 125 mg IV x1.   Vitals at the of dictation:  Temperature 98.9, blood pressure 125/65, pulse  109, respiratory rate 22, O2 sat 94%.      Tara B. Corky Downs, M.D.  Electronically Signed     MBB/MEDQ  D:  03/22/2005  T:  03/22/2005  Job:  [id number]   cc:   Tara Churn. Cyndie Mayo, M.D.  Fax: [phone]

## 2010-08-23 NOTE — Consult Note (Signed)
[name], [name]            ACCOUNT NO.:  [id number]   MEDICAL RECORD NO.:  [id number]          PATIENT TYPE:  INP   LOCATION:  0161                         FACILITY:  [facility]   PHYSICIAN:  Genene Churn. Granfortuna, M.D.DATE OF BIRTH:  [birth year]   DATE OF CONSULTATION:  03/09/2005  DATE OF DISCHARGE:                                   CONSULTATION   This is a hematology consultation to evaluate this 30 year old woman with  known sickle cell disease, currently admitted with pneumonia, and acute  sickle crisis.   [name] is a 30 year old African woman who just came to [facility] 3  months ago to pursue a master's degree in nutrition at [employer].  She has known  sickle cell disease. The disease has been severe with crises occurring about  every 2 months. A typical crisis causes chest, rib, back and extremity pain.  She had a cholecystectomy at age 27. Otherwise she has had no other major  complications from her sickle cell disease and denies any previous ocular  toxicity, pulmonary toxicity including pneumonia, or nephrotoxicity. Her  only medication is folic acid.   She was admitted here on March 07, 2005 with an approximate 1 week history  of a cough productive of yellow sputum, low grade fevers, and then sudden  onset of a generalized sickle crisis in the typical pattern of pain that she  has had with prior crises. She came to the emergency department where  initial temperature was 101 degrees. Oxygen saturation 89% on room air,  hemoglobin 7.8, reticulocyte count 16%, LDH 522, bilirubin not done. White  count 17,500. A urinalysis was negative. However, a chest x-ray showed  bilateral lower lobe infiltrates and associated effusions. A CT scan of the  chest was done and confirmed extensive bilateral pneumonia but no  significant amount of pleural fluid. No evidence for pulmonary emboli. No  pericardial effusion. No mediastinal adenopathy.   She was started on a triple  antibiotic program with vancomycin, Rocephin and  azithromycin. Oxygen hydration and pain medications given. Over the first 24  hours, she spiked a temperature to 103 degrees. But subsequent temperature  trend is down. Her condition appear to be stabilizing until this morning  when she became increasingly dyspneic at rest and had a sudden fall in her  oxygen saturation recorded as down to 50% on 5 liters of oxygen. A repeat  chest x-ray was done which I have reviewed. This shows worsening air space  disease compared with the admission film.   She was transferred to the intensive care unit for further management. I am  asked to evaluate her with respect to possibility that she has developed an  acute chest syndrome.   PAST MEDICAL HISTORY:  As noted above. No surgeries except for  cholecystectomy. She has had prior blood transfusions for sickle crises  while living in [place]. She denies any history of hepatitis. She has had  malaria with a flare-up as recently as 5 months ago. She takes no chronic  medication to suppress malaria. She denies any history of heart disease,  heart murmur, rheumatic fever, no history  of pneumonia or tuberculosis. She  has had negative TB skin tests recently. No history of HIV and HIV test  negative during this admission. No diabetes. No stomach ulcers. No kidney  stones. No history of seizure or convulsions. No history of blood clots. The  only outpatient medicine was folic acid. No allergies.   FAMILY HISTORY:  Parents are healthy and still live in [place]. She has two  sisters, 62 and 76. The 73 year old also has sickle cell disease.   SOCIAL HISTORY:  She is single, no children, Technical sales engineer at [employer],  nonsmoker.   REVIEW OF SYSTEMS:  She has had some headaches over the last few days with  the fever and cough. No change in vision. No abdominal pain, no change in  bowel habit, no hematochezia or melena, no dysuria or frequency. No  hematuria.    PHYSICAL EXAM:  GENERAL:  A pleasant African woman currently in only minimal  respiratory distress.  VITAL SIGNS:  Oxygen saturation now up to 100% with oxygen at 15 liters by  face mask. Blood pressure 105/60, respirations 32, pulse 108 regular,  temperature 98.1.  HEENT:  The sclerae are icteric, the pupils are equal and reactive to light.  The pharynx without erythema or exudate.  NECK:  Supple.  LUNGS:  Clear with tubular breath sounds and resonant to percussion  throughout despite x-ray report of effusions.  CARDIAC:  Regular cardiac rhythm. No murmur, no gallop. No pericardial or  pleural rub. No adenopathy.  BREAST:  Not examined.  ABDOMEN:  Soft and nontender. No mass, no organomegaly.  EXTREMITIES:  No edema, no cyanosis, no calf tenderness or swelling.  NEUROLOGIC:  Mental status intact. Cranial nerves grossly normal, motor  strength 5/5, reflexes 1+ symmetric.   Post hydration hemoglobin today is 6.7 with hematocrit 18%, white count is  23,000, differential not done. Platelets 311,000. BUN is 2, creatinine of  0.4. Blood cultures no growth to date. Rapid strep test is negative, urine  pregnancy test negative.   IMPRESSION:  1.  Sickle cell disease presenting with acute crisis due to bilateral      pneumonia. Acute fall in oxygenation after initial stabilization.   Acute chest syndrome is certainly a possibility. However, at present she  appears to be responding to conservative treatment with increased oxygen  administration and rapid improvement of her oxygen saturation now up to 100%  on a non-rebreather oxygen mask. There is no laboratory evidence for  accelerated hemolysis or for aplastic crisis.   RECOMMENDATIONS:  I would agree with current management. Simple transfusion  today. Continue to try to maintain good oxygen saturation and hydration.  Continue folic acid. I would exchange transfusion only if her clinical situation deteriorates. In that case, we would need  to transfer her to [facility] and have a vascular catheter placed. I will help coordinate  this if necessary.   With respect to her sickle cell disease in general, she may be a candidate  for hydroxyurea after hospital discharge when acute crisis and infection  have resolved to reduce the frequency and severity of crises.   1.  History of malaria with recent flare-up about 5 months ago. This could      also contribute to ongoing hemolysis. I will review her peripheral blood      film. No antimalarial therapy indicated at present.   Thank you for this consultation.      Genene Churn. Cyndie Chime, M.D.  Electronically Signed  JMG/MEDQ  D:  03/09/2005  T:  03/10/2005  Job:  [id number]   cc:   Nelma Rothman, MD

## 2010-08-23 NOTE — Discharge Summary (Signed)
[name], [name]            ACCOUNT NO.:  [id number]   MEDICAL RECORD NO.:  [id number]          PATIENT TYPE:  INP   LOCATION:  0161                         FACILITY:  [facility]   PHYSICIAN:  Nelma Rothman, MD   DATE OF BIRTH:  03/16/1981   DATE OF ADMISSION:  03/07/2005  DATE OF DISCHARGE:                                 DISCHARGE SUMMARY   ADDENDUM:  Please note that this is an interim discharge summary and the  final discharge summary with medications will be dictated by the discharging  physician.   CURRENT DIAGNOSES:  1.  Bilateral pneumonia and pleural effusions consistent with acute chest      syndrome.  2.  Sickle cell pain crisis.  3.  Sickle cell disease, possibly sickle/__________ based on hemoglobin      electrophoresis.  4.  History of malaria.   PROCEDURES:  1.  Chest x-ray on admission demonstrated pneumonias in the right and left      lower lobes.  2.  A CT angiogram of the chest on admission demonstrated extensive      bilateral lower lobe pneumonia but no pulmonary emboli or dissection.  3.  A chest x-ray the day following admission demonstrated worsening      bilateral air space disease with worsening bilateral effusions.  4.  Daily serial chest x-rays following this show persistent/slightly      improving bilateral air space disease.  5.  Abdominal ultrasound demonstrated bilateral pleural effusions, normal      liver, and common bile duct normal in size without evidence of biliary      ductal dilatation.  The patient is status post cholecystectomy.   LABORATORY DATA:  White blood cell count on admission was 17.5, which peaked  at 23.2 and now is within normal limits at 10.2.  Hemoglobin on admission  was 7.8.  She did receive two units of packed red blood cells and it is  currently 8.6.  On admission absolute reticulocyte count was 396 at 16%.  Total bilirubin peaked at 5.0 and it is now 2.9.  LDH peaked at 522 and is  now 332.  Urine pregnancy test  was negative.  A hemoglobin electrophoresis  demonstrated 55% S, 25% A, 3.8% A2, and 5.5% F.  HIV test was negative.  A  Streptococcus urine antigen was negative.  Streptococcal group A screen was  negative.  Urine culture was negative.  Urine legionella antigen was  negative.  Blood cultures are negative.   HOSPITAL COURSE:  [name] is a very pleasant 30 year old lady with a  history of sickle cell disease who presented with worsening chest pain and  shortness of breath.  A chest x-ray on admission was consistent with  bilateral pneumonia.  A CT angiogram of the chest performed in the emergency  department was negative for pulmonary embolus but did demonstrate extensive  bibasilar pneumonia.  She was admitted to the hospital and covered broadly  for community-acquired pneumonia with vancomycin, Rocephin, and  Azithromycin.  She was also started on oxygen supplementation and IV fluids,  as well as p.r.n. pain medications.  Despite this, over the first 36 hours  of admission, she had a gradual increase in her white blood cell count, as  well as her oxygen requirement.  She was persistently febrile.  On the  morning of March 09, 2005 when I saw her, she was extremely tachypneic and  uncomfortable.  There was an oxygen saturation of 60% recorded on 6 L,  although this was during a coughing episode.  Nevertheless, she appeared to  be clinically deteriorating rapidly and was moved to the ICU and placed on  Bi-PAP with 50% Fi-O2.  Oxygen saturations on this were consistently 99-  100%.  A chest x-ray demonstrated extensive bilateral air space disease,  much worse than on admission.  She was given a simple transfusion of two  units of packed red blood cells and over the course of the next 24 hours,  she seemed to improve somewhat.  She was breathing much more comfortably.  Her hemoglobin responded appropriately to the transfusion.  Dr. Cyndie Chime  of hematology kindly provided  consultation, which I requested with regard to  the possible need for exchange transfusion.  As she has gradually improved  with current therapy, this has not been required.  On the morning of  dictation, a chest x-ray was slightly improved but does still demonstrate  significant bilateral infiltrate.  However, she seems to be doing much  better and so we will stop continuous Bi-PAP and use only as needed.  She  will be placed on supplemental oxygen to maintain saturations as close to  100% as possible.  She is continued on normal saline at 100 mL/hour.  She is  also on Dilaudid as needed for pain.  She has had a marked decrease in the  amount of her chest pain and is much more comfortable on nasal cannula.  Today would be day six of Rocephin and Azithromycin.  Vancomycin has since  been discontinued.   The patient has complained of some right upper quadrant pain associated with  the nausea and vomiting.  Liver function tests are within normal limits  except for an elevated bilirubin, likely secondary to hemolysis.  The  patient is status post cholecystectomy.  However, a right upper quadrant  ultrasound was performed for further evaluation.  This was essentially  normal with the liver appearing normal and no common bile duct dilatation.  I suspect her pain is likely secondary to an acute hepatic crisis secondary  to sickling versus and increase in the __________ system secondary to  increased cell turnover from hemolysis.  This appears to be stable if not  slightly improved over the past few days.   Please note that this is an interim discharge summary and the final  discharge summary will be dictated at the time of discharge.           ______________________________  Nelma Rothman, MD     RAR/MEDQ  D:  03/12/2005  T:  03/12/2005  Job:  [id number]

## 2010-08-23 NOTE — H&P (Signed)
NAMELACHELLE, RISSLER            ACCOUNT NO.:  [id number]   MEDICAL RECORD NO.:  [id number]          PATIENT TYPE:  INP   LOCATION:  0102                         FACILITY:  [facility]   PHYSICIAN:  Lonia Blood, M.D.       DATE OF BIRTH:  December 06, 1980   DATE OF ADMISSION:  03/07/2005  DATE OF DISCHARGE:                                HISTORY & PHYSICAL   PRIMARY CARE PHYSICIAN:  [facility].   CHIEF COMPLAINT:  Chest pain.   HISTORY OF PRESENT ILLNESS:  Miss [name] is a 30 year old woman known with  sickle cell disease, who reports that for the past week she has been getting  severe chest pain with significant shortness of breath and coughing and  having a fever.  She has traveled recently to [place] to be with her  sisters for Thanksgiving and after that when she got back, she did some  heavy exercises without proper hydration.   PAST MEDICAL HISTORY:  1.  Status post cholecystectomy in 2000.  2.  Sickle cell SS disease with frequent crises.  3.  Blood transfusion when she was a child in [place].   MEDICATIONS:  The patient takes folic acid 1 mg daily.   ALLERGIES:  No known drug allergies.   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  She started her studies here in [place] three moths ago.  She lived all  her life in [place] she has two sisters in [place]. One of them is  Darral Dash, phone number [phone].  She also has a friend here in [place]  by the name of Ollen Gross, phone number [phone].  Khandi does not smoke  cigarettes.  She does not drink alcohol. She does use any illicit drugs.   FAMILY HISTORY:  She has a sister with sickle cell disease. Both her parents  are alive and healthy.   REVIEW OF SYSTEMS:  Positive for some sore throat, positive for nausea,  positive for vomiting, positive for pleuritic chest pain, positive for  sputum production.  Negative for bone pain, negative for hemoptysis.  All  other systems are  negative.   PHYSICAL EXAMINATION:  VITAL SIGNS:  Temperature 101, pulse 110,  respirations 24, blood pressure 133/76,  GENERAL:  The patient appears well-developed, well-nourished in no acute  distress.  She is alert, oriented to place, person and time.  HEENT:  Normocephalic, atraumatic.  Pupils equal, round and reactive to  light and accommodation.  Extraocular muscles were intact.  Sclerae have  significant icterus.  Conjunctivae are pale.  Throat has displaced erythema  and edematous tonsils. There is no frank ulcerations or purulent exudate of  the tonsils.  The mouth is without ulcerations.  NECK:  Supple without JVD.  No carotid bruits and no thyromegaly.  RESPIRATORY:  She has bibasilar crackles and decreased sounds at both bases.  There is no wheezes and no rhonchi.  The patient's heart has regular rate  and rhythm with no murmurs, rubs or gallops.  ABDOMEN:  Soft, nontender.  There is a post cholecystectomy scar.  EXTREMITIES:  No edema.  SKIN:  Warm and dry.  NEUROLOGIC: Nonfocal.  PSYCHIATRIC:  The patient does not display any hallucinations. She has  intact thought process, memory.   LABORATORY DATA AT THE TIME OF ADMISSION:  Sodium 137, potassium 3.7,  glucose 110, BUN 3, creatinine 0.5, hemoglobin is 7.8 with an absolute  reticulocyte count of 396.1.  Chest CT was negative for PE and shows  bilateral lower lobe air space disease, most consistent with pneumonia. The  patient had also group A streptococcal screen from the throat which was  negative.  White blood cell count was 17,000.  Platelet count 371.   ASSESSMENT/PLAN:  1.  Severe bilateral air space disease.  Given history, given the productive      cough with yellow sputum, pleuritic chest pain, fever, chills, elevated      white blood cell count, this is most likely pneumonia.  Other causes to      consider would be chest syndrome from sickle cell disease versus other      causes, might be as remote as  vasculitis for example.  In terms of      etiology for pneumonia, will proceed with blood cultures, sputum      cultures.  Send a urine for streptococcus pneumonia antigen. Send a      urine for Legionella antigen.  She could have typical pathogens as      streptococcal pneumonia or hemophilus, but also in someone who is      borderline and compromised from the sickle cell, need to be careful      about ruling out MRSA pneumonia.   The plan is to start this patient empirically on Rocephin, Zithromax, and  vancomycin.  Also we will obtain an HIV test and the patient was advised in  regards to that, given the fact that the pneumonia is bilateral and that she  received a blood transfusion years ago in [place].   In terms of consideration for tuberculosis, I do not feel this is likely  given the fact that the patient had negative PPD three months ago.  She is  BCD vaccinated.  The course of her disease right now is very acute.  She has  not lost weight.  She does not appear cachectic, plus the location of the  pneumonia is very, very unlikely to be tuberculosis.   Problem #2.  Sickle cell crisis.  At this point it is very difficult to  ascertain if this preceded the pneumonia or is secondary to the pneumonia  but the plan is to treat the patient with IV fluids, followup closely her  hemoglobin, proceed with oxygen supplementation per nasal cannula and  adequate pain control.  Will also advise the patient to use incentive  spirometry and try to treat the pleuritic pain with ibuprofen.  We will  continue the folic acid.  We will discuss with the patient when she gets  better about options of taking hydroxyurea on a daily basis.  GI prophylaxis  will be undertaken using Protonix. Deep venous thrombosis prophylaxis will  be undertaken using PAS hoses and early ambulation.      Lonia Blood, M.D.  Electronically Signed    SL/MEDQ  D:  03/07/2005  T:  03/07/2005  Job:  [id number]

## 2010-11-22 ENCOUNTER — Inpatient Hospital Stay (HOSPITAL_COMMUNITY)
Admission: AD | Admit: 2010-11-22 | Payer: PRIVATE HEALTH INSURANCE | Source: Ambulatory Visit | Admitting: Internal Medicine

## 2011-01-10 LAB — DIFFERENTIAL
Basophils Absolute: 0
Basophils Relative: 0
Eosinophils Absolute: 0.1 — ABNORMAL LOW
Eosinophils Relative: 0
Lymphs Abs: 1.7
Monocytes Absolute: 0.4
Monocytes Absolute: 0.9
Neutro Abs: 12.6 — ABNORMAL HIGH
Neutrophils Relative %: 82 — ABNORMAL HIGH
Neutrophils Relative %: 87 — ABNORMAL HIGH

## 2011-01-10 LAB — CBC
HCT: 29.1 — ABNORMAL LOW
Hemoglobin: 10.1 — ABNORMAL LOW
MCHC: 34.5
MCHC: 35.5
MCV: 110.8 — ABNORMAL HIGH
MCV: 113 — ABNORMAL HIGH
Platelets: 349
RBC: 2.58 — ABNORMAL LOW
RBC: 2.61 — ABNORMAL LOW

## 2011-01-10 LAB — I-STAT 8, (EC8 V) (CONVERTED LAB)
BUN: 10
BUN: 9
Bicarbonate: 25.4 — ABNORMAL HIGH
Bicarbonate: 26.6 — ABNORMAL HIGH
Glucose, Bld: 107 — ABNORMAL HIGH
Glucose, Bld: 99
Hemoglobin: 10.5 — ABNORMAL LOW
Hemoglobin: 10.9 — ABNORMAL LOW
Operator id: 198171
Sodium: 134 — ABNORMAL LOW
TCO2: 27
TCO2: 28
pCO2, Ven: 36.6 — ABNORMAL LOW
pCO2, Ven: 40 — ABNORMAL LOW
pH, Ven: 7.45 — ABNORMAL HIGH

## 2011-01-10 LAB — POCT I-STAT CREATININE
Operator id: 146091
Operator id: 198171

## 2011-01-10 LAB — URINALYSIS, ROUTINE W REFLEX MICROSCOPIC
Glucose, UA: NEGATIVE
Hgb urine dipstick: NEGATIVE
Specific Gravity, Urine: 1.017
Urobilinogen, UA: 1

## 2011-01-10 LAB — RAPID URINE DRUG SCREEN, HOSP PERFORMED
Amphetamines: NOT DETECTED
Barbiturates: NOT DETECTED
Benzodiazepines: NOT DETECTED
Cocaine: NOT DETECTED
Opiates: NOT DETECTED
Tetrahydrocannabinol: NOT DETECTED

## 2011-01-10 LAB — MONONUCLEOSIS SCREEN: Mono Screen: NEGATIVE

## 2011-01-10 LAB — POCT PREGNANCY, URINE: Preg Test, Ur: NEGATIVE

## 2011-01-14 LAB — COMPREHENSIVE METABOLIC PANEL
ALT: 18
AST: 40 — ABNORMAL HIGH
Albumin: 4.2
Alkaline Phosphatase: 72
Calcium: 9.1
GFR calc Af Amer: >60
Glucose, Bld: 120 — ABNORMAL HIGH
Potassium: 3.5
Sodium: 138
Total Protein: 7.2

## 2011-01-14 LAB — URINALYSIS, ROUTINE W REFLEX MICROSCOPIC
Ketones, ur: NEGATIVE
Leukocytes, UA: NEGATIVE
Nitrite: NEGATIVE
Protein, ur: NEGATIVE
pH: 6

## 2011-01-14 LAB — DIFFERENTIAL
Basophils Relative: 1
Eosinophils Relative: 1
Lymphs Abs: 3
Monocytes Relative: 6

## 2011-01-14 LAB — CBC
MCHC: 35.3
Platelets: 388
RDW: 18.7 — ABNORMAL HIGH

## 2011-01-14 LAB — URINE MICROSCOPIC-ADD ON

## 2011-01-14 LAB — RETICULOCYTES: Retic Count, Absolute: 202.5 — ABNORMAL HIGH

## 2012-02-19 LAB — CBC AND DIFFERENTIAL
HCT: 26 % — AB (ref 36–46)
Hemoglobin: 9 g/dL — AB (ref 12.0–16.0)

## 2012-02-19 LAB — HEPATIC FUNCTION PANEL: Bilirubin, Total: 7 mg/dL

## 2012-02-19 LAB — BASIC METABOLIC PANEL
BUN: 7 mg/dL (ref 4–21)
Glucose: 90 mg/dL

## 2012-03-17 DIAGNOSIS — T80911A Delayed hemolytic transfusion reaction, unspecified incompatibility, initial encounter: Secondary | ICD-10-CM | POA: Insufficient documentation

## 2012-03-17 DIAGNOSIS — I8290 Acute embolism and thrombosis of unspecified vein: Secondary | ICD-10-CM | POA: Insufficient documentation

## 2012-05-27 ENCOUNTER — Encounter: Payer: Self-pay | Admitting: General Practice

## 2012-05-27 DIAGNOSIS — D571 Sickle-cell disease without crisis: Secondary | ICD-10-CM | POA: Insufficient documentation

## 2012-12-15 ENCOUNTER — Telehealth: Payer: Self-pay

## 2012-12-15 ENCOUNTER — Ambulatory Visit: Payer: PRIVATE HEALTH INSURANCE | Admitting: Internal Medicine

## 2012-12-15 NOTE — Telephone Encounter (Signed)
This CM left vm for a call back to schedule an appointment.

## 2012-12-16 NOTE — Telephone Encounter (Signed)
This CM spoke with Mrs. [name] to schedule appt to establish care with Dr. Ashley Royalty on 12/21/12 at 3:45pm.

## 2012-12-21 ENCOUNTER — Non-Acute Institutional Stay (HOSPITAL_COMMUNITY)
Admission: RE | Admit: 2012-12-21 | Discharge: 2012-12-21 | Disposition: A | Discharge status: Home / Self Care | Payer: PRIVATE HEALTH INSURANCE | Source: Ambulatory Visit | Attending: Internal Medicine | Admitting: Internal Medicine

## 2012-12-21 ENCOUNTER — Encounter: Payer: Self-pay | Admitting: Internal Medicine

## 2012-12-21 ENCOUNTER — Ambulatory Visit (INDEPENDENT_AMBULATORY_CARE_PROVIDER_SITE_OTHER): Payer: BC Managed Care – PPO | Admitting: Internal Medicine

## 2012-12-21 VITALS — BP 118/69 | HR 79 | Temp 98.3°F | Resp 16 | Ht 62.0 in | Wt 151.0 lb

## 2012-12-21 DIAGNOSIS — D571 Sickle-cell disease without crisis: Secondary | ICD-10-CM

## 2012-12-21 DIAGNOSIS — D5700 Hb-SS disease with crisis, unspecified: Secondary | ICD-10-CM | POA: Insufficient documentation

## 2012-12-21 MED ORDER — IBUPROFEN 800 MG PO TABS: 800.0000 mg | ORAL_TABLET | Freq: Three times a day (TID) | ORAL | Status: DC | PRN

## 2012-12-21 NOTE — Progress Notes (Signed)
Attempted to draw labs from peripheral site- CBC w/diff; CMP; Hemoglobinopathy and Lactate Dehydrogenase; unable to get labs; pt states will return next week in AM for lab draw with [facility] phlebotomist

## 2012-12-21 NOTE — Progress Notes (Signed)
  Subjective:    Patient ID: [name], female    DOB: 1980/05/02, 32 y.o.   MRN: [id number]  HPI: Pt with Hb SS is here to establish as primary care. She is followed for her Hb SS at [facility]. She has no complaints at this time and no other medical problems. The patient and her husband are contemplating starting a family the patient is currently on hydroxyurea.  Pain: Only pain is usually associated with menstrual cramps. Uses about 2.5mg  of  oxycodone immediate release.  Vision Exam: Last exam 2 years ago  Pneumonia Vaccine: 6 /09/2012 ([facility])  Influenza Vaccine: Will obtain at work.  Review of Systems  Constitutional: Negative.   HENT: Negative.   Eyes: Negative.   Respiratory: Negative.   Cardiovascular: Negative.   Gastrointestinal: Negative.   Endocrine: Negative.   Genitourinary: Negative.   Musculoskeletal: Negative.   Skin: Negative.   Allergic/Immunologic: Negative.   Neurological: Negative.   Hematological: Negative.   Psychiatric/Behavioral: Negative.        Objective:   Physical Exam  Constitutional: She is oriented to person, place, and time. She appears well-developed and well-nourished.  HENT:  Head: Normocephalic and atraumatic.  Eyes: Conjunctivae and EOM are normal. Pupils are equal, round, and reactive to light. No scleral icterus.  Neck: Normal range of motion. Neck supple. No JVD present. No thyromegaly present.  Cardiovascular: Normal rate and regular rhythm.  Exam reveals no gallop and no friction rub.   No murmur heard. Pulmonary/Chest: Effort normal and breath sounds normal. She has no wheezes. She has no rales.  Abdominal: Soft. Bowel sounds are normal. She exhibits no distension and no mass. There is no tenderness.  Musculoskeletal: Normal range of motion.  Neurological: She is alert and oriented to person, place, and time. No cranial nerve deficit.  Skin: Skin is warm and dry.  Psychiatric: She has a normal mood and affect. Her  behavior is normal. Judgment and thought content normal.          Assessment & Plan:  1. Hb SS: Patient will continue on folic acid 1 mg daily. We discussed increasing her hydroxyurea to 1500 mg instead of 1000 mg. However I will obtain a CBC and hemoglobin electrophoresis before making his hydroxyurea. 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   Vision: The patient has not had a vision examination within the last year. She was scheduled for examination within the next month.  Pain associated with physical strap cells. The patient states that she only has pain associated with her menses. I suspect that at that time she is experiencing an acute blood loss anemia contributing to acute vaso-occlusive episodes. The patient is not a candidate for contraception as she and her husband are contemplating starting a family. Patient has used ibuprofen and was significantly improved her pain. I prescribed ibuprofen 800 mg every 8 hours when necessary in addition the patient has oxycodone and she typically takes about 2.5-5 mg to alleviate her pain. She'll use pain medications around the time of her menses.  2. preventative care: The patient received the pneumococcal vaccine 1 09/11/2038 at [facility]. She will or influenza vaccine alert and fax a confirmation to [place] for scan and her chart.  Labs: CBC with differential, CMET., hemoglobin electrophoresis  RTC: One month

## 2012-12-22 ENCOUNTER — Other Ambulatory Visit: Payer: Self-pay | Admitting: Hematology

## 2012-12-22 ENCOUNTER — Other Ambulatory Visit: Payer: Self-pay | Admitting: *Deleted

## 2012-12-22 DIAGNOSIS — D571 Sickle-cell disease without crisis: Secondary | ICD-10-CM

## 2012-12-29 ENCOUNTER — Other Ambulatory Visit: Payer: BC Managed Care – PPO | Admitting: *Deleted

## 2012-12-29 DIAGNOSIS — D571 Sickle-cell disease without crisis: Secondary | ICD-10-CM

## 2012-12-29 LAB — LACTATE DEHYDROGENASE: LDH: 468 U/L — ABNORMAL HIGH (ref 94–250)

## 2012-12-31 LAB — HEMOGLOBINOPATHY EVALUATION
Hgb A2 Quant: 3 % (ref 2.2–3.2)
Hgb A: 0 % — ABNORMAL LOW (ref 96.8–97.8)
Hgb F Quant: 16.2 % — ABNORMAL HIGH (ref 0.0–2.0)

## 2013-01-20 ENCOUNTER — Encounter: Payer: Self-pay | Admitting: Internal Medicine

## 2013-01-20 ENCOUNTER — Ambulatory Visit (INDEPENDENT_AMBULATORY_CARE_PROVIDER_SITE_OTHER): Payer: BC Managed Care – PPO | Admitting: Internal Medicine

## 2013-01-20 VITALS — BP 92/79 | HR 82 | Temp 98.3°F | Resp 16 | Ht 62.0 in | Wt 153.0 lb

## 2013-01-20 DIAGNOSIS — D571 Sickle-cell disease without crisis: Secondary | ICD-10-CM

## 2013-01-20 MED ORDER — HYDROXYUREA 500 MG PO CAPS: ORAL_CAPSULE | ORAL | Status: DC

## 2013-01-25 NOTE — Progress Notes (Signed)
  Subjective:    Patient ID: [name], female    DOB: 1981/02/04, 32 y.o.   MRN: [id number]  HPI: Pt here to follow-up with concerns re: approaching pregnancy in the context of her diseaase. She states that she and her husband are thinking of starting a family in the next year. We have reviewed the electrophoresis and  discussed increasing her Hydrea dose. I have also discussed with patient that once she is pregnant her folic acid dose should increase to 2 mg daily.    Review of Systems  Constitutional: Negative.   HENT: Negative.   Eyes: Negative.   Respiratory: Negative.   Cardiovascular: Negative.   Gastrointestinal: Negative.   Endocrine: Negative.   Genitourinary: Negative.   Skin: Negative.   Allergic/Immunologic: Negative.   Neurological: Negative.   Hematological: Negative.   Psychiatric/Behavioral: Negative.        Objective:   Physical Exam  Constitutional: She is oriented to person, place, and time. She appears well-developed and well-nourished.  HENT:  Head: Normocephalic and atraumatic.  Eyes: Conjunctivae and EOM are normal. Pupils are equal, round, and reactive to light. No scleral icterus.  Neck: Normal range of motion. Neck supple. No JVD present. No thyromegaly present.  Cardiovascular: Normal rate and regular rhythm.  Exam reveals no gallop and no friction rub.   No murmur heard. Pulmonary/Chest: Effort normal and breath sounds normal. She has no wheezes. She has no rales.  Abdominal: Soft. Bowel sounds are normal. She exhibits no distension and no mass. There is no tenderness.  Musculoskeletal: Normal range of motion.  Neurological: She is alert and oriented to person, place, and time. No cranial nerve deficit.  Skin: Skin is warm and dry.  Psychiatric: She has a normal mood and affect. Her behavior is normal. Judgment and thought content normal.          Assessment & Plan:  1. Hb SS without crisis: Pt has a high percentage of Hb S and a  marginal percentage of Hb F. I will increase Hydrea to an QOD dose of 1500 mg alternating with 1000 mg. Recheck Hb electrophoresis in 2 months and CBC in one month.   -With regard to symptoms, patient has had very little pain requiring use of opiate analgesics. No refills needed today.  2. Immunization: Pt has had her influenza vaccine.She is overdue for TDAP but does no want it today.  3. Health Maintenance: Pt is overdue fro PAP smear but states that she usually has that done at her Ob-Gyn.   RTC:3 months.   Labs: CBC wit diff in one month, Hb Electrophoresis 2 weeks before next appointment.

## 2013-03-23 ENCOUNTER — Telehealth: Payer: Self-pay | Admitting: Internal Medicine

## 2013-03-23 ENCOUNTER — Other Ambulatory Visit: Payer: Self-pay | Admitting: Internal Medicine

## 2013-03-23 ENCOUNTER — Other Ambulatory Visit: Payer: BC Managed Care – PPO | Admitting: *Deleted

## 2013-03-23 DIAGNOSIS — D571 Sickle-cell disease without crisis: Secondary | ICD-10-CM

## 2013-03-23 LAB — CBC WITH DIFFERENTIAL/PLATELET
Basophils Absolute: 0 10*3/uL (ref 0.0–0.1)
Basophils Relative: 1 % (ref 0–1)
Eosinophils Absolute: 0.1 10*3/uL (ref 0.0–0.7)
Eosinophils Relative: 1 % (ref 0–5)
HCT: 26 % — ABNORMAL LOW (ref 36.0–46.0)
Hemoglobin: 9.1 g/dL — ABNORMAL LOW (ref 12.0–15.0)
MCH: 35.4 pg — ABNORMAL HIGH (ref 26.0–34.0)
MCHC: 35 g/dL (ref 30.0–36.0)
MCV: 101.2 fL — ABNORMAL HIGH (ref 78.0–100.0)
Monocytes Absolute: 0.4 10*3/uL (ref 0.1–1.0)
Monocytes Relative: 6 % (ref 3–12)
Neutro Abs: 4 10*3/uL (ref 1.7–7.7)
RDW: 22.8 % — ABNORMAL HIGH (ref 11.5–15.5)

## 2013-03-24 MED ORDER — HYDROXYUREA 500 MG PO CAPS: ORAL_CAPSULE | ORAL | Status: DC

## 2013-03-24 NOTE — Telephone Encounter (Signed)
Hydrea prescription refill completed

## 2013-03-25 LAB — HEMOGLOBINOPATHY EVALUATION
Hemoglobin Other: 0 %
Hgb A2 Quant: 3.2 % (ref 2.2–3.2)
Hgb A: 0 % — ABNORMAL LOW (ref 96.8–97.8)

## 2013-04-06 ENCOUNTER — Telehealth: Payer: Self-pay | Admitting: Internal Medicine

## 2013-04-06 DIAGNOSIS — D571 Sickle-cell disease without crisis: Secondary | ICD-10-CM

## 2013-04-06 MED ORDER — HYDROXYUREA 500 MG PO CAPS: ORAL_CAPSULE | ORAL | Status: DC

## 2013-04-06 NOTE — Telephone Encounter (Signed)
Patient is requesting to pick up hard copy of RX because she is going to be changing pharmacies.

## 2013-04-06 NOTE — Telephone Encounter (Signed)
Prescription filled for Hydrea. Please note that this is the same prescription, however patient is changing her pharmacy and needs a new prescription as the old one cannot be transferred.

## 2013-04-11 ENCOUNTER — Telehealth: Payer: Self-pay | Admitting: *Deleted

## 2013-04-11 NOTE — Telephone Encounter (Signed)
Rx ready for pick- up Hydroxyrea 500 mg

## 2013-04-12 ENCOUNTER — Telehealth: Payer: Self-pay | Admitting: Internal Medicine

## 2013-04-12 DIAGNOSIS — D571 Sickle-cell disease without crisis: Secondary | ICD-10-CM

## 2013-04-12 MED ORDER — HYDROXYUREA 500 MG PO CAPS: ORAL_CAPSULE | ORAL | Status: DC

## 2013-04-12 NOTE — Telephone Encounter (Signed)
Patient is requesting 90 day supply per refill for insurance purposes.

## 2013-04-12 NOTE — Telephone Encounter (Signed)
Pt requested 90 prescription instead of 30 day prescription. 30 day prescription for #65 tabs discarded and #195 tabs for 90 days re-issued.

## 2013-04-21 ENCOUNTER — Encounter: Payer: Self-pay | Admitting: Internal Medicine

## 2013-04-21 ENCOUNTER — Ambulatory Visit (INDEPENDENT_AMBULATORY_CARE_PROVIDER_SITE_OTHER): Payer: BC Managed Care – PPO | Admitting: Internal Medicine

## 2013-04-21 VITALS — BP 125/72 | HR 82 | Temp 98.6°F | Resp 16 | Ht 61.0 in | Wt 154.0 lb

## 2013-04-21 DIAGNOSIS — D571 Sickle-cell disease without crisis: Secondary | ICD-10-CM

## 2013-04-21 DIAGNOSIS — Z3009 Encounter for other general counseling and advice on contraception: Secondary | ICD-10-CM

## 2013-04-21 MED ORDER — HYDROXYUREA 500 MG PO CAPS: 1500.0000 mg | ORAL_CAPSULE | Freq: Every day | ORAL | Status: DC

## 2013-04-21 MED ORDER — HYDROXYUREA 100 MG/ML ORAL SUSPENSION: 1500.0000 mg | Freq: Every day | ORAL | Status: DC

## 2013-04-21 NOTE — Progress Notes (Signed)
Patient ID: [name], female   DOB: 06/03/1980, 33 y.o.   MRN: [id number] Pt here for follow up on increase in Hydrea and it's effect on her Hb F and Hb S. Pt is planning to try to conceive during the summer months and plans to stop her Hydrea in the spring. She has no other concerns at this time.  Review of Systems  Constitutional: Negative.  HENT: Negative.  Eyes: Negative.  Respiratory: Negative.  Cardiovascular: Negative.  Gastrointestinal: Negative.  Endocrine: Negative.  Genitourinary: Negative.  Skin: Negative.  Allergic/Immunologic: Negative.  Neurological: Negative.  Hematological: Negative.  Psychiatric/Behavioral: Negative    Physical Exam  Constitutional: She is oriented to person, place, and time. She appears well-developed and well-nourished.  HENT:  Head: Normocephalic and atraumatic.  Eyes: Conjunctivae and EOM are normal. Pupils are equal, round, and reactive to light. No scleral icterus.  Neck: Normal range of motion. Neck supple. No JVD present. No thyromegaly present.  Cardiovascular: Normal rate and regular rhythm. Exam reveals no gallop and no friction rub.  No murmur heard.  Pulmonary/Chest: Effort normal and breath sounds normal. She has no wheezes. She has no rales.  Abdominal: Soft. Bowel sounds are normal. She exhibits no distension and no mass. There is no tenderness.  Musculoskeletal: Normal range of motion.  Neurological: She is alert and oriented to person, place, and time. No cranial nerve deficit.  Skin: Skin is warm and dry.  Psychiatric: She has a normal mood and affect. Her behavior is normal. Judgment and thought content normal.     A/P:1. Sickle cell disease - Increase Hydrea 1500 mg  daily. We discussed the need for good hydration, monitoring of hydration status, avoidance of heat, cold, stress, and infection triggers. We discussed the risks and benefits of Hydrea, including bone marrow suppression, the possibility of GI upset, skin  ulcers, hair thinning, and teratogenicity. The patient was reminded of the need to seek medical attention of any symptoms of bleeding, anemia, or infection. Continue folic acid 1 mg daily to prevent aplastic bone marrow crises.   2. Pulmonary evaluation - Patient denies severe recurrent wheezes, shortness of breath with exercise, or persistent cough. If these symptoms develop, pulmonary function tests with spirometry will be ordered, and if abnormal, plan on referral to Pulmonology for further evaluation.  3. Cardiac - Routine screening for pulmonary hypertension is not recommended.  4. Eye - High risk of proliferative retinopathy.Had vision examination in the last 3 months however patient wants a second opinion as she was told that thy saw something but was unsure if it was related to her SCD. I have referred her to Dr. Clarisa KindredGeiger.  5. Immunization status - She declines vaccines today. Yearly influenza vaccination is recommended, as well as being up to date with Meningococcal and Pneumococcal vaccines.   6. Acute and chronic painful episodes - We agreed on Oxycodone 5 mg q 4 hrs prn. However patient uses mostly ibuprofen about 3 x month.. We discussed that she is to receive her Schedule II prescriptions only from us. She is also aware that her prescription history is available to us online through the [facility]. Controlled substance agreement signed (Date). We reminded (Pt) that all patients receiving Schedule II narcotics must be seen for follow up every three months. We reviewed the terms of our pain agreement, including the need to keep medicines in a safe locked location away from children or pets, and the need to report excess sedation or constipation, measures to avoid constipation,  and policies related to early refills and stolen prescriptions. According to the [place] [facility] program, we have reviewed details related to analgesia, adverse effects, aberrant behaviors.  7. Iron overload from  chronic transfusion: Pt has not been chronically transfused . Will check Ferritin levels with next labs.  8. Vitamin D deficiency - Pt at risk for Vitamin D deficiency. Will check vitamin D levels with next labs.   The above recommendations are taken from the NIH Evidence-Based Management of Sickle Cell Disease: Expert Panel Report, 16109.   9. Family Planning: Pt is clear about time line for initiating attempts at pregnancy. Pt would be an optimal candidate for serial exchange transfusions to a goal Hb S of <50% and then straight transfusions to suppress bone marrow production of RBC's during pregnancy. Will refer to [facility] for her annual Hematological visit. Contrception will need to be initiated and maintained for 3 months  when patient stops Hydrea, and folic acid will need to increase to 2 mg daily when  contracaption initiated.  RTC: 1 month  Labs: 1 week before next visit:- Electrophoresis, Vitamin D, Ferritin, CBC with diff.

## 2013-04-25 ENCOUNTER — Encounter: Payer: Self-pay | Admitting: Internal Medicine

## 2013-04-25 DIAGNOSIS — Z3009 Encounter for other general counseling and advice on contraception: Secondary | ICD-10-CM | POA: Insufficient documentation

## 2013-05-19 ENCOUNTER — Encounter: Payer: PRIVATE HEALTH INSURANCE | Admitting: Obstetrics & Gynecology

## 2013-05-26 ENCOUNTER — Encounter: Payer: PRIVATE HEALTH INSURANCE | Admitting: Obstetrics & Gynecology

## 2013-05-27 ENCOUNTER — Telehealth: Payer: Self-pay | Admitting: Internal Medicine

## 2013-05-27 NOTE — Telephone Encounter (Signed)
Left message to call regarding an appointment.

## 2013-05-27 NOTE — Telephone Encounter (Signed)
Patient called stating Dr. Okey Duprerawford ([facility]) would like patient to increase Hydroxyurea to 2000 mg daily (4 tablets). Patient would like to know if Dr. Ashley RoyaltyMatthews is in agreement. Please call.

## 2013-05-30 ENCOUNTER — Telehealth: Payer: Self-pay

## 2013-05-30 NOTE — Telephone Encounter (Signed)
Please call patient and let her know that I am in agreement with the increase in Hydrea. A long as it has been 12 weeks since last increase.

## 2013-06-01 ENCOUNTER — Ambulatory Visit: Payer: PRIVATE HEALTH INSURANCE | Admitting: Internal Medicine

## 2013-06-01 NOTE — Telephone Encounter (Signed)
05/30/2013 11:25 AM Phone (Outgoing) Mayo, Tara Addisonrene (Self) [phone] (H)    Left Message- Pt was contacted and VM was left to contact office with answer to her pending question.    05/30/2013 4:22 PM Phone (Outgoing) Mayo, Tara Addisonrene (Self) [phone] (H)    Completed- Pt was contacted and msg was given to her. Pt was in agreement of responce.and was in total understanding of this.

## 2013-06-14 ENCOUNTER — Encounter: Payer: PRIVATE HEALTH INSURANCE | Admitting: Obstetrics & Gynecology

## 2013-06-16 ENCOUNTER — Ambulatory Visit: Payer: PRIVATE HEALTH INSURANCE | Admitting: Internal Medicine

## 2013-07-21 ENCOUNTER — Ambulatory Visit (INDEPENDENT_AMBULATORY_CARE_PROVIDER_SITE_OTHER): Payer: BC Managed Care – PPO | Admitting: Family Medicine

## 2013-07-21 ENCOUNTER — Encounter: Payer: Self-pay | Admitting: Family Medicine

## 2013-07-21 VITALS — BP 119/73 | HR 96 | Temp 98.0°F | Resp 20 | Ht 61.0 in | Wt 156.0 lb

## 2013-07-21 DIAGNOSIS — Z3009 Encounter for other general counseling and advice on contraception: Secondary | ICD-10-CM

## 2013-07-21 DIAGNOSIS — D571 Sickle-cell disease without crisis: Secondary | ICD-10-CM

## 2013-07-21 LAB — COMPLETE METABOLIC PANEL WITH GFR
ALBUMIN: 4.4 g/dL (ref 3.5–5.2)
ALT: 11 U/L (ref 0–35)
AST: 24 U/L (ref 0–37)
Alkaline Phosphatase: 61 U/L (ref 39–117)
BUN: 8 mg/dL (ref 6–23)
CALCIUM: 8.8 mg/dL (ref 8.4–10.5)
CO2: 27 meq/L (ref 19–32)
CREATININE: 0.46 mg/dL — AB (ref 0.50–1.10)
Chloride: 103 mEq/L (ref 96–112)
GFR, Est African American: >89 mL/min
GFR, Est Non African American: >89 mL/min
Glucose, Bld: 106 mg/dL — ABNORMAL HIGH (ref 70–99)
POTASSIUM: 4 meq/L (ref 3.5–5.3)
SODIUM: 134 meq/L — AB (ref 135–145)
TOTAL PROTEIN: 7.7 g/dL (ref 6.0–8.3)
Total Bilirubin: 3.4 mg/dL — ABNORMAL HIGH (ref 0.2–1.2)

## 2013-07-21 MED ORDER — HYDROXYUREA 500 MG PO CAPS: 1500.0000 mg | ORAL_CAPSULE | Freq: Every day | ORAL | Status: DC

## 2013-07-21 MED ORDER — FOLIC ACID 1 MG PO TABS: 1.0000 mg | ORAL_TABLET | Freq: Every day | ORAL | Status: DC

## 2013-07-21 NOTE — Progress Notes (Signed)
   Subjective:    Patient ID: [name], female    DOB: 05/04/1980, 33 y.o.   MRN: [id number]  HPI 33 year old patient with a history of sickle cell anemia HbSS in office for 3 month follow up examination. Patient states that she has been doing very well with sickle cell disease. Patient does not have to take pain medications and is consistent with daily medication regimen. She recently had yearly visit with hematologist, Dr. Okey Duprerawford, who increased daily Hydrea dosage.    Review of Systems  Constitutional: Negative.   Cardiovascular: Negative.   Gastrointestinal: Negative.   Musculoskeletal: Negative.   Skin: Negative.   Allergic/Immunologic: Negative.   Neurological: Negative.   Hematological: Negative.   Psychiatric/Behavioral: Negative.        Objective:   Physical Exam  Constitutional: She is oriented to person, place, and time. She appears well-developed and well-nourished. No distress.  HENT:  Head: Normocephalic.  Eyes: Conjunctivae are normal. Pupils are equal, round, and reactive to light. Lids are everted and swept, no foreign bodies found.  Neck: Normal range of motion. Neck supple.  Cardiovascular: Normal rate, regular rhythm and normal heart sounds.   Pulmonary/Chest: Effort normal.  Abdominal: Soft. Bowel sounds are normal.  Musculoskeletal: Normal range of motion.  Neurological: She is alert and oriented to person, place, and time.  Skin: Skin is warm and dry.  Psychiatric: She has a normal mood and affect.          Assessment & Plan:  1. Sickle cell anemia: Patient reports that Dr. Okey Duprerawford increased hydrea to 4 tables per day (2000 mg). Will obtain CBC w/differential/CMP to evaluate changes in baseline measurements. Patient states that she will discontinue Hydrea on 08/04/2013 as discussed previously with Dr. Ashley RoyaltyMatthews. Patient will discontinue Hydrea for 3 months prior to conceiving. She and spouse will start the conception process in August '2015. We  discussed the need for good hydration, monitoring of hydration status, avoidance of heat, cold, stress, and infection triggers. We discussed the risks and benefits of Hydrea, including bone marrow suppression, the possibility of GI upset, skin ulcers, hair thinning, and teratogenicity. The patient was reminded of the need to seek medical attention of any symptoms of bleeding, anemia, or infection. Continue folic acid 1 mg daily to prevent aplastic bone marrow crises.   2. OB-gyn: Patient's initial visit with ob-gyn in 07/22/2013 with Henrene PastorKelly Legette. Patient will be establishing care.  3. Eye:  [facility] 3 months ago. Was referred to opthalmology due to high risk of proliferative retinopathy by Dr. Ashley RoyaltyMatthews, to schedule  Appointment.  4. Immunization status:  Hematologist suggested HIB vaccination, patient will report to local health department. Patient had meningiococcal vaccination in February.  5.  Vitamin D deficiency - Drisdol 50,000 units weekly was started by Dr. Okey Duprerawford, hematologist on 07/14/2013    RTC: Complete physical examination with Dr. Ashley RoyaltyMatthews in July prior to starting conception  Labs: Hemoglobinopathy, CBC w/diff, cmp, lipids, vitamin D, TSH 1 week prior to CPE   Tara MaroonLachina M Hollis, FNP

## 2013-07-22 ENCOUNTER — Telehealth: Payer: Self-pay | Admitting: Internal Medicine

## 2013-07-22 ENCOUNTER — Encounter: Payer: Self-pay | Admitting: Family Medicine

## 2013-07-22 LAB — CBC WITH DIFFERENTIAL/PLATELET
BASOS ABS: 0.1 10*3/uL (ref 0.0–0.1)
Basophils Relative: 2 % — ABNORMAL HIGH (ref 0–1)
Eosinophils Absolute: 0.1 10*3/uL (ref 0.0–0.7)
Eosinophils Relative: 2 % (ref 0–5)
HCT: 26.1 % — ABNORMAL LOW (ref 36.0–46.0)
Hemoglobin: 9.6 g/dL — ABNORMAL LOW (ref 12.0–15.0)
Lymphocytes Relative: 55 % — ABNORMAL HIGH (ref 12–46)
Lymphs Abs: 2.9 10*3/uL (ref 0.7–4.0)
MCH: 42.1 pg — ABNORMAL HIGH (ref 26.0–34.0)
MCHC: 36.8 g/dL — ABNORMAL HIGH (ref 30.0–36.0)
MCV: 114.5 fL — ABNORMAL HIGH (ref 78.0–100.0)
Monocytes Absolute: 0.3 10*3/uL (ref 0.1–1.0)
Monocytes Relative: 6 % (ref 3–12)
NEUTROS ABS: 1.9 10*3/uL (ref 1.7–7.7)
Neutrophils Relative %: 35 % — ABNORMAL LOW (ref 43–77)
PLATELETS: 745 10*3/uL — AB (ref 150–400)
RBC: 2.28 MIL/uL — ABNORMAL LOW (ref 3.87–5.11)
RDW: 21.8 % — AB (ref 11.5–15.5)
WBC: 5.3 10*3/uL (ref 4.0–10.5)

## 2013-07-28 ENCOUNTER — Ambulatory Visit (INDEPENDENT_AMBULATORY_CARE_PROVIDER_SITE_OTHER): Payer: BC Managed Care – PPO | Admitting: Obstetrics & Gynecology

## 2013-07-28 ENCOUNTER — Encounter: Payer: Self-pay | Admitting: Obstetrics & Gynecology

## 2013-07-28 VITALS — BP 116/77 | HR 86 | Resp 16 | Ht 61.0 in | Wt 152.0 lb

## 2013-07-28 DIAGNOSIS — Z124 Encounter for screening for malignant neoplasm of cervix: Secondary | ICD-10-CM

## 2013-07-28 DIAGNOSIS — N6320 Unspecified lump in the left breast, unspecified quadrant: Secondary | ICD-10-CM

## 2013-07-28 DIAGNOSIS — Z1151 Encounter for screening for human papillomavirus (HPV): Secondary | ICD-10-CM

## 2013-07-28 DIAGNOSIS — Z01419 Encounter for gynecological examination (general) (routine) without abnormal findings: Secondary | ICD-10-CM

## 2013-07-28 DIAGNOSIS — N63 Unspecified lump in unspecified breast: Secondary | ICD-10-CM

## 2013-07-28 DIAGNOSIS — D571 Sickle-cell disease without crisis: Secondary | ICD-10-CM

## 2013-07-28 MED ORDER — FOLIC ACID 1 MG PO TABS: 5.0000 mg | ORAL_TABLET | Freq: Every day | ORAL | Status: DC

## 2013-07-28 NOTE — Addendum Note (Signed)
Addended by: Arne ClevelandHUTCHINSON, Bela Bonaparte J on: 07/28/2013 05:17 PM   Modules accepted: Orders

## 2013-07-28 NOTE — Addendum Note (Signed)
Addended by: Lesly DukesLEGGETT, Kaliopi Blyden H on: 07/28/2013 04:27 PM   Modules accepted: Orders

## 2013-07-28 NOTE — Addendum Note (Signed)
Addended by: Arne ClevelandHUTCHINSON, MANDY J on: 07/28/2013 05:21 PM   Modules accepted: Orders

## 2013-07-28 NOTE — Progress Notes (Addendum)
  Subjective:     [name] is a 33 y.o. female here for a routine exam.  Current complaints: questions about infertility, left breast mass and tenderness.  Personal health questionnaire reviewed: yes. Pt has sickle cell disease--well controlled.  Has had transfusions and acute chest in the past.  FOB has nml hgb electrophoresis   Gynecologic History Patient's last menstrual period was 06/26/2013. Contraception: condoms Last Pap: nml per pt  Obstetric History OB History  Gravida Para Term Preterm AB SAB TAB Ectopic Multiple Living  0 0 0 0 0 0 0 0 0 0          The following portions of the patient's history were reviewed and updated as appropriate: allergies, current medications, past family history, past medical history, past social history, past surgical history and problem list.  Review of Systems Pertinent items are noted in HPI.    Objective:      Filed Vitals:   07/28/13 1505  BP: 116/77  Pulse: 86  Resp: 16  Height: 5\' 1"  (1.549 m)  Weight: 152 lb (68.947 kg)   Vitals:  WNL General appearance: alert, cooperative and no distress Head: Normocephalic, without obvious abnormality, atraumatic Eyes: negative Throat: lips, mucosa, and tongue normal; teeth and gums normal Lungs: clear to auscultation bilaterally Breasts: normal appearance, whickening of breast tissue on left breast upper outer quadrant.  ?? Cyst ?? Heart: regular rate and rhythm Abdomen: soft, non-tender; bowel sounds normal; no masses,  no organomegaly  Pelvic:  External Genitalia:  Tanner V, no lesion Urethra:  No lesion Vagina:  Pink, normal rugae, no blood or discharge Cervix:  No CMT, no lesion Uterus:  Normal size and contour, non tender Adnexa:  Normal, no masses, non tender  Extremities: no edema, redness or tenderness in the calves or thighs Skin: no lesions or rash Lymph nodes: Axillary adenopathy: none        Assessment:    Healthy female exam.    Plan:    Education reviewed: low fat, low cholesterol diet, self breast exams and weight bearing exercise. Contraception: condoms. Follow up in: 1 year.    Folic acid 5 mg prior to trying to conceive. Baseline U cx, PFT, 24 hour urine, HepB&C, type and screen, CMP. Retinal evaluation 2 months ago was nml, echo done and nml (pt has murmur), cbc, u cx,  Pt wants to know if hydroxyurea impairs fertility--texted Dr. Jeannie FendY with question. Left breast [place]

## 2013-07-29 ENCOUNTER — Other Ambulatory Visit: Payer: Self-pay | Admitting: Obstetrics & Gynecology

## 2013-07-29 DIAGNOSIS — N6320 Unspecified lump in the left breast, unspecified quadrant: Secondary | ICD-10-CM

## 2013-08-01 ENCOUNTER — Other Ambulatory Visit: Payer: Self-pay | Admitting: Obstetrics & Gynecology

## 2013-08-01 LAB — COMPREHENSIVE METABOLIC PANEL
ALK PHOS: 55 U/L (ref 39–117)
ALT: 11 U/L (ref 0–35)
AST: 21 U/L (ref 0–37)
Albumin: 4.5 g/dL (ref 3.5–5.2)
BUN: 9 mg/dL (ref 6–23)
CO2: 24 mEq/L (ref 19–32)
Calcium: 9.2 mg/dL (ref 8.4–10.5)
Chloride: 104 mEq/L (ref 96–112)
Creat: 0.51 mg/dL (ref 0.50–1.10)
Glucose, Bld: 91 mg/dL (ref 70–99)
POTASSIUM: 4.4 meq/L (ref 3.5–5.3)
SODIUM: 136 meq/L (ref 135–145)
TOTAL PROTEIN: 7.8 g/dL (ref 6.0–8.3)
Total Bilirubin: 4.6 mg/dL — ABNORMAL HIGH (ref 0.2–1.2)

## 2013-08-01 LAB — CREATININE CLEARANCE, URINE, 24 HOUR
CREAT CLEAR: 298 mL/min — AB (ref 75–115)
CREATININE 24H UR: 2190 mg/d — AB (ref 700–1800)
Creatinine, Urine: 62.6 mg/dL
Creatinine: 0.51 mg/dL (ref 0.50–1.10)

## 2013-08-01 LAB — PROTEIN, URINE, 24 HOUR: Protein, Urine: <3 mg/dL

## 2013-08-01 LAB — HEPATITIS B SURFACE ANTIBODY, QUANTITATIVE: HEPATITIS B-POST: 1000 m[IU]/mL

## 2013-08-01 LAB — HEPATITIS C ANTIBODY: HCV AB: NEGATIVE

## 2013-08-02 LAB — URINE CULTURE
Colony Count: NO GROWTH
Organism ID, Bacteria: NO GROWTH

## 2013-08-08 ENCOUNTER — Other Ambulatory Visit: Payer: Self-pay | Admitting: Obstetrics & Gynecology

## 2013-08-08 ENCOUNTER — Other Ambulatory Visit: Payer: Self-pay

## 2013-08-08 ENCOUNTER — Telehealth: Payer: Self-pay | Admitting: *Deleted

## 2013-08-08 DIAGNOSIS — N6320 Unspecified lump in the left breast, unspecified quadrant: Secondary | ICD-10-CM

## 2013-08-08 LAB — HEPATITIS B SURFACE ANTIGEN: HEP B S AG: NEGATIVE

## 2013-08-08 NOTE — Telephone Encounter (Signed)
Called lab to add on hep b antigen per Dr Penne LashLeggett request

## 2013-08-09 ENCOUNTER — Encounter: Payer: Self-pay | Admitting: Obstetrics & Gynecology

## 2013-08-09 ENCOUNTER — Telehealth: Payer: Self-pay | Admitting: *Deleted

## 2013-08-09 ENCOUNTER — Other Ambulatory Visit: Payer: Self-pay | Admitting: *Deleted

## 2013-08-09 NOTE — Telephone Encounter (Signed)
Message copied by Granville LewisLARK, Timi Reeser L on Tue Aug 09, 2013  8:37 AM ------      Message from: Lesly DukesLEGGETT, KELLY H      Created: Tue Aug 09, 2013  4:20 AM       Mervyn GayLora,      Call patient that test results are nml.  Mild restriction on PFTs and can follow up with primary care MD.      Pt wanted to know if hydroxyurea causes decreased fertility--no.  However, women with sickle Cell disease can have decreased ovarian reserve.  Please convey this as well.              Thanks ------

## 2013-08-09 NOTE — Telephone Encounter (Signed)
Unable to leave message on home phone.  Letter mailed to pt's home address with test results and advice.

## 2013-08-11 ENCOUNTER — Other Ambulatory Visit: Payer: Self-pay | Admitting: *Deleted

## 2013-08-12 ENCOUNTER — Ambulatory Visit
Admission: RE | Admit: 2013-08-12 | Discharge: 2013-08-12 | Disposition: A | Discharge status: Home / Self Care | Payer: BC Managed Care – PPO | Source: Ambulatory Visit | Attending: Obstetrics & Gynecology | Admitting: Obstetrics & Gynecology

## 2013-08-12 ENCOUNTER — Other Ambulatory Visit: Payer: Self-pay | Admitting: Obstetrics & Gynecology

## 2013-08-12 DIAGNOSIS — N6320 Unspecified lump in the left breast, unspecified quadrant: Secondary | ICD-10-CM

## 2013-08-19 ENCOUNTER — Encounter: Payer: Self-pay | Admitting: Obstetrics & Gynecology

## 2013-08-19 DIAGNOSIS — N6001 Solitary cyst of right breast: Secondary | ICD-10-CM | POA: Insufficient documentation

## 2013-10-17 ENCOUNTER — Encounter: Payer: Self-pay | Admitting: Internal Medicine

## 2013-11-25 ENCOUNTER — Telehealth: Payer: Self-pay | Admitting: Internal Medicine

## 2013-11-25 NOTE — Telephone Encounter (Signed)
Left message on voicemail for patient to call to reschedule appointment for 12/05/13.

## 2013-12-01 ENCOUNTER — Telehealth (HOSPITAL_COMMUNITY): Payer: Self-pay | Admitting: *Deleted

## 2013-12-01 NOTE — Telephone Encounter (Signed)
Called patient to inform about Integrative Care in Sickle Cell Anemia: Caring for Mind, Body, & Spirit on Friday, Sept.18,2015. Patient aware and acknowledges.

## 2013-12-05 ENCOUNTER — Encounter: Payer: Self-pay | Admitting: Internal Medicine

## 2013-12-15 ENCOUNTER — Ambulatory Visit: Payer: BC Managed Care – PPO | Admitting: Family Medicine

## 2013-12-19 ENCOUNTER — Ambulatory Visit: Payer: BC Managed Care – PPO | Admitting: Family Medicine

## 2014-01-20 ENCOUNTER — Telehealth: Payer: Self-pay | Admitting: Internal Medicine

## 2014-01-20 NOTE — Telephone Encounter (Signed)
Patient called to find out of labs are needed before her physical on 02/09/14

## 2014-01-23 NOTE — Telephone Encounter (Signed)
Called and left patient a message that she is due for lab work before her next appointment, advised that she could come in for fasting labs or call us back if she had any questions. Thanks!  Thank you!

## 2014-01-27 ENCOUNTER — Other Ambulatory Visit: Payer: BC Managed Care – PPO

## 2014-01-27 DIAGNOSIS — D571 Sickle-cell disease without crisis: Secondary | ICD-10-CM

## 2014-01-28 LAB — FERRITIN: Ferritin: 68 ng/mL (ref 10–291)

## 2014-01-28 LAB — CBC WITH DIFFERENTIAL/PLATELET
Basophils Absolute: 0.1 10*3/uL (ref 0.0–0.1)
Basophils Relative: 1 % (ref 0–1)
Eosinophils Absolute: 0.1 10*3/uL (ref 0.0–0.7)
Eosinophils Relative: 1 % (ref 0–5)
HCT: 22.6 % — ABNORMAL LOW (ref 36.0–46.0)
Hemoglobin: 7.8 g/dL — ABNORMAL LOW (ref 12.0–15.0)
LYMPHS ABS: 3.1 10*3/uL (ref 0.7–4.0)
LYMPHS PCT: 41 % (ref 12–46)
MCH: 28.8 pg (ref 26.0–34.0)
MCHC: 34.5 g/dL (ref 30.0–36.0)
MCV: 83.4 fL (ref 78.0–100.0)
Monocytes Absolute: 0.5 10*3/uL (ref 0.1–1.0)
Monocytes Relative: 6 % (ref 3–12)
NEUTROS PCT: 51 % (ref 43–77)
Neutro Abs: 3.8 10*3/uL (ref 1.7–7.7)
Platelets: 390 10*3/uL (ref 150–400)
RBC: 2.71 MIL/uL — AB (ref 3.87–5.11)
RDW: 25.2 % — ABNORMAL HIGH (ref 11.5–15.5)
WBC: 7.5 10*3/uL (ref 4.0–10.5)

## 2014-01-28 LAB — VITAMIN D 25 HYDROXY (VIT D DEFICIENCY, FRACTURES): Vit D, 25-Hydroxy: 53 ng/mL (ref 30–89)

## 2014-01-31 LAB — HEMOGLOBINOPATHY EVALUATION
HGB F QUANT: 11 % — AB (ref 0.0–2.0)
HGB S QUANTITAION: 86 % — AB
Hemoglobin Other: 0 %
Hgb A2 Quant: 3 % (ref 2.2–3.2)
Hgb A: 0 % — ABNORMAL LOW (ref 96.8–97.8)

## 2014-02-09 ENCOUNTER — Encounter: Payer: Self-pay | Admitting: Internal Medicine

## 2014-02-09 ENCOUNTER — Ambulatory Visit (INDEPENDENT_AMBULATORY_CARE_PROVIDER_SITE_OTHER): Payer: BC Managed Care – PPO | Admitting: Internal Medicine

## 2014-02-09 VITALS — BP 106/60 | HR 56 | Temp 98.2°F | Wt 151.0 lb

## 2014-02-09 DIAGNOSIS — Z0000 Encounter for general adult medical examination without abnormal findings: Secondary | ICD-10-CM

## 2014-02-09 DIAGNOSIS — D571 Sickle-cell disease without crisis: Secondary | ICD-10-CM

## 2014-02-09 DIAGNOSIS — R0600 Dyspnea, unspecified: Secondary | ICD-10-CM

## 2014-02-09 DIAGNOSIS — E559 Vitamin D deficiency, unspecified: Secondary | ICD-10-CM | POA: Insufficient documentation

## 2014-02-09 DIAGNOSIS — R5383 Other fatigue: Secondary | ICD-10-CM

## 2014-02-09 LAB — IRON AND TIBC
%SAT: 16 % — ABNORMAL LOW (ref 20–55)
Iron: 51 ug/dL (ref 42–145)
TIBC: 329 ug/dL (ref 250–470)
UIBC: 278 ug/dL (ref 125–400)

## 2014-02-09 LAB — TSH: TSH: 2.941 u[IU]/mL (ref 0.350–4.500)

## 2014-02-09 LAB — LACTATE DEHYDROGENASE: LDH: 531 U/L — ABNORMAL HIGH (ref 94–250)

## 2014-02-09 MED ORDER — VITAMIN D 50 MCG (2000 UT) PO CAPS: 1.0000 | ORAL_CAPSULE | Freq: Every day | ORAL | Status: DC

## 2014-02-09 MED ORDER — OXYCODONE HCL 5 MG PO TABS: 5.0000 mg | ORAL_TABLET | ORAL | Status: DC | PRN

## 2014-02-09 MED ORDER — FERROUS SULFATE 325 (65 FE) MG PO TBEC: 325.0000 mg | DELAYED_RELEASE_TABLET | Freq: Two times a day (BID) | ORAL | Status: DC

## 2014-02-09 MED ORDER — FOLIC ACID 1 MG PO TABS: 2.0000 mg | ORAL_TABLET | Freq: Every day | ORAL | Status: DC

## 2014-02-09 NOTE — Progress Notes (Signed)
Patient ID: [name], female   DOB: 08/19/1980, 33 y.o.   MRN: [id number]   [name], is a 33 y.o. female  [id number]  [id number]  DOB - 06/15/1980  CC:  Chief Complaint  Patient presents with  . Follow-up       HPI: [name] is a 33 y.o. female here today for annual physical. Pt has been feeling tired and having DOE. Her Hb which is usually at a baseline of 9.5-10 g/dL. Is now down to 7.8 since being off of the Hydrea.  Her symptoms are similar to that of when she was anemic. She denies CP, dizziness or near syncope. 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 Other wise she has nocomplaints.  Patient has No headache, No chest pain, No abdominal pain - No Nausea, No new tingling or numbness, and no Cough.  No Known Allergies Past Medical History  Diagnosis Date  . Hemoglobin S-S disease   . Ovarian cyst   . Jaundice   . Anemia    Current Outpatient Prescriptions on File Prior to Visit  Medication Sig Dispense Refill  . ibuprofen (ADVIL,MOTRIN) 800 MG tablet Take 1 tablet (800 mg total) by mouth every 8 (eight) hours as needed for pain. 30 tablet 0  . hydroxyurea (HYDREA) 500 MG capsule Take 3 capsules (1,500 mg total) by mouth daily. 270 capsule 3   No current facility-administered medications on file prior to visit.   Family History  Problem Relation Age of Onset  . Hypertension Mother   . Hypertension Father    History   Social History  . Marital Status: Married    Spouse Name: N/A    Number of Children: N/A  . Years of Education: N/A   Occupational History  . Not on file.   Social History Main Topics  . Smoking status: Never Smoker   . Smokeless tobacco: Not on file  . Alcohol Use: No  . Drug Use: No  . Sexual Activity:    Partners: Male    Birth Control/ Protection: Condom   Other Topics Concern  . Not on file   Social History Narrative     Review of Systems: Constitutional: Negative for fever, chills, diaphoresis, activity change, appetite change and fatigue. HENT: Negative for ear pain, nosebleeds, congestion, facial swelling, rhinorrhea, neck pain, neck stiffness and ear discharge.  Eyes: Negative for pain, discharge, redness, itching and visual disturbance. Respiratory: Negative for cough, choking, chest tightness, shortness of breath, wheezing and stridor.  Cardiovascular: Negative for chest pain, palpitations and leg swelling. Gastrointestinal: Negative for abdominal distention. Genitourinary: Negative for dysuria, urgency, frequency, hematuria, flank pain, decreased urine volume, difficulty urinating and dyspareunia.  Musculoskeletal: Negative for back pain, joint swelling, arthralgia and gait problem. Neurological: Negative for dizziness, tremors, seizures, syncope, facial asymmetry, speech difficulty, weakness, light-headedness, numbness and headaches.  Hematological: Negative for adenopathy. Does not bruise/bleed easily. Psychiatric/Behavioral: Negative for hallucinations, behavioral problems, confusion, dysphoric mood, decreased concentration and agitation.    Objective:   Filed Vitals:   02/09/14 1357  BP: 106/60  Pulse: 56  Temp: 98.2 F (36.8 C)    Physical Exam: Constitutional: Patient appears well-developed and well-nourished. No distress. HENT: Normocephalic, atraumatic, External right and left ear normal. Oropharynx is clear and moist.  Eyes: Conjunctivae and EOM are normal. PERRLA, mild scleral icterus. Neck: Normal ROM. Neck supple. No JVD. No tracheal deviation. No thyromegaly. CVS:  RRR, S1/S2 +, II/VI  Systolic ejection murmur at 5th intercostal space., no gallops, no carotid bruit.  Pulmonary: Effort and breath sounds normal, no stridor, rhonchi, wheezes, rales.  Abdominal: Soft. BS +, no distension, tenderness, rebound or guarding.  Musculoskeletal: Normal range of motion. No edema and no  tenderness.  Lymphadenopathy: No lymphadenopathy noted, cervical, inguinal or axillary Neuro: Alert. Normal reflexes, muscle tone coordination. No cranial nerve deficit. Skin: Skin is warm and dry. No rash noted. Not diaphoretic. No erythema. No pallor. Psychiatric: Normal mood and affect. Behavior, judgment, thought content normal.  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 So will check Hb levels and kinetics. Continue folic acid to prevent bone marrow aplasia. - CBC with Differential - Reticulocytes - Lactate Dehydrogenase - folic acid (FOLVITE) 1 MG tablet; Take 2 tablets (2 mg total) by mouth daily.  Dispense: 60 tablet; Refill: 4 - Pt has also been having more episodes of pain since  - oxyCODONE (OXY IR/ROXICODONE) 5 MG immediate release tablet; Take 1 tablet (5 mg total) by mouth every 4 (four) hours as needed for severe pain. Take 1/2 tab to 1 tab every 4-6 hours for pain as needed  Dispense: 60 tablet; Refill: 0 - ferrous sulfate 325 (65 FE) MG EC tablet; Take 1 tablet (325 mg total) by mouth 2 (two) times daily at 10 AM and 5 PM.  Dispense: 60 tablet; Refill: 0  2. Other fatigue - Review of Ferritin shows levels <10. Pt has had no transfusions in the last several years. I will start empirically on Iron 325 mg BID and recheck Hb in about 6 weeks. I will also check TSH in light of fatigue. - Iron and TIBC - Lactate Dehydrogenase - ferrous sulfate 325 (65 FE) MG EC tablet; Take 1 tablet (325 mg total) by  mouth 2 (two) times daily at 10 AM and 5 PM.  Dispense: 60 tablet; Refill: 0 - TSH  3. Vitamin D insufficiency - Pt currently being supplemented with Drisdol. Her last levels were adequate. Will supplement at a lower level as noted below. - Cholecalciferol (VITAMIN D) 2000 UNITS CAPS; Take 1 capsule (2,000 Units total) by mouth daily.  Dispense: 30 capsule; Refill: 6  4. Dyspnea - This could very likely be related to decrease in Hb levels but the other causes cannot be ignored. Will obtain a D-dimer and if positive will proceed to CT angiogram. If negative will obtain a CXR. Also check ECHO as patient with HB SS is at risk for Pulmonary HTN.  - D-Dimer - 12 lead EKG - 2D Echocardiogram without contrast; Future    Follow-up on 1 month with MD for DOE, Anemia.  The patient was given clear instructions to go to ER or return to medical center if symptoms don't improve, worsen or new problems develop. The patient verbalized understanding. The patient was told to call to get lab  results if they haven't heard anything in the next week.     This note has been created with Education officer, environmental. Any transcriptional errors are unintentional.    MATTHEWS,MICHELLE A., MD [facility] [place], [place] [phone]   02/09/2014, 2:39 PM

## 2014-02-10 LAB — CBC WITH DIFFERENTIAL/PLATELET
BASOS PCT: 0 % (ref 0–1)
Basophils Absolute: 0 10*3/uL (ref 0.0–0.1)
Eosinophils Absolute: 0.1 10*3/uL (ref 0.0–0.7)
Eosinophils Relative: 1 % (ref 0–5)
HCT: 22 % — ABNORMAL LOW (ref 36.0–46.0)
HEMOGLOBIN: 7.6 g/dL — AB (ref 12.0–15.0)
LYMPHS ABS: 2.5 10*3/uL (ref 0.7–4.0)
Lymphocytes Relative: 46 % (ref 12–46)
MCH: 29.2 pg (ref 26.0–34.0)
MCHC: 34.5 g/dL (ref 30.0–36.0)
MCV: 84.6 fL (ref 78.0–100.0)
MONOS PCT: 6 % (ref 3–12)
Monocytes Absolute: 0.3 10*3/uL (ref 0.1–1.0)
NEUTROS PCT: 47 % (ref 43–77)
Neutro Abs: 2.6 10*3/uL (ref 1.7–7.7)
Platelets: 367 10*3/uL (ref 150–400)
RBC: 2.6 MIL/uL — AB (ref 3.87–5.11)
RDW: 25.7 % — ABNORMAL HIGH (ref 11.5–15.5)
WBC: 5.5 10*3/uL (ref 4.0–10.5)

## 2014-02-10 LAB — URINALYSIS
BILIRUBIN URINE: NEGATIVE
Glucose, UA: NEGATIVE mg/dL
Hgb urine dipstick: NEGATIVE
Ketones, ur: NEGATIVE mg/dL
LEUKOCYTES UA: NEGATIVE
Nitrite: NEGATIVE
PH: 6.5 (ref 5.0–8.0)
Protein, ur: NEGATIVE mg/dL
Specific Gravity, Urine: 1.005 (ref 1.005–1.030)
Urobilinogen, UA: 1 mg/dL (ref 0.0–1.0)

## 2014-02-10 LAB — RETICULOCYTES
ABS Retic: 371.8 10*3/uL — ABNORMAL HIGH (ref 19.0–186.0)
RBC.: 2.6 MIL/uL — AB (ref 3.87–5.11)
RETIC CT PCT: 14.3 % — AB (ref 0.4–2.3)

## 2014-02-13 ENCOUNTER — Telehealth: Payer: Self-pay | Admitting: Internal Medicine

## 2014-02-13 DIAGNOSIS — Z0000 Encounter for general adult medical examination without abnormal findings: Secondary | ICD-10-CM | POA: Insufficient documentation

## 2014-02-13 DIAGNOSIS — R0600 Dyspnea, unspecified: Secondary | ICD-10-CM | POA: Insufficient documentation

## 2014-02-16 ENCOUNTER — Other Ambulatory Visit: Payer: Self-pay | Admitting: Internal Medicine

## 2014-02-16 ENCOUNTER — Other Ambulatory Visit: Payer: BC Managed Care – PPO

## 2014-02-16 ENCOUNTER — Telehealth: Payer: Self-pay

## 2014-02-16 ENCOUNTER — Ambulatory Visit (HOSPITAL_COMMUNITY)
Admission: RE | Admit: 2014-02-16 | Discharge: 2014-02-16 | Disposition: A | Discharge status: Home / Self Care | Payer: BC Managed Care – PPO | Source: Ambulatory Visit | Attending: Internal Medicine | Admitting: Internal Medicine

## 2014-02-16 ENCOUNTER — Encounter (HOSPITAL_COMMUNITY): Payer: Self-pay

## 2014-02-16 DIAGNOSIS — R0602 Shortness of breath: Secondary | ICD-10-CM | POA: Insufficient documentation

## 2014-02-16 DIAGNOSIS — R002 Palpitations: Secondary | ICD-10-CM | POA: Diagnosis present

## 2014-02-16 DIAGNOSIS — R42 Dizziness and giddiness: Secondary | ICD-10-CM

## 2014-02-16 DIAGNOSIS — R7989 Other specified abnormal findings of blood chemistry: Secondary | ICD-10-CM

## 2014-02-16 DIAGNOSIS — R0600 Dyspnea, unspecified: Secondary | ICD-10-CM

## 2014-02-16 DIAGNOSIS — Z0000 Encounter for general adult medical examination without abnormal findings: Secondary | ICD-10-CM

## 2014-02-16 LAB — LIPID PANEL
CHOLESTEROL: 122 mg/dL (ref 0–200)
HDL: 43 mg/dL (ref >39)
LDL Cholesterol: 67 mg/dL (ref 0–99)
Total CHOL/HDL Ratio: 2.8 Ratio
Triglycerides: 60 mg/dL (ref <150)
VLDL: 12 mg/dL (ref 0–40)

## 2014-02-16 LAB — D-DIMER, QUANTITATIVE: D-Dimer, Quant: 2.77 ug/mL-FEU — ABNORMAL HIGH (ref 0.00–0.48)

## 2014-02-16 MED ORDER — IOHEXOL 350 MG/ML SOLN
100.0000 mL | Freq: Once | INTRAVENOUS | Status: AC | PRN
  Administered 2014-02-16: 100 mL via INTRAVENOUS

## 2014-02-16 NOTE — Telephone Encounter (Signed)
Patient called back and advised patient to come to [facility] for CT ASAP. Patient verbalized understanding. Thanks!

## 2014-02-16 NOTE — Telephone Encounter (Signed)
Left message for patient to call.

## 2014-02-16 NOTE — Progress Notes (Signed)
Lab result called and d-dimer elevated ina setting of dizziness, DOE and palpitations. Will order stat CT angiogram.

## 2014-03-20 ENCOUNTER — Encounter: Payer: Self-pay | Admitting: Internal Medicine

## 2014-03-20 ENCOUNTER — Ambulatory Visit (INDEPENDENT_AMBULATORY_CARE_PROVIDER_SITE_OTHER): Payer: BC Managed Care – PPO | Admitting: Internal Medicine

## 2014-03-20 VITALS — BP 134/72 | HR 92 | Temp 98.2°F | Resp 18 | Ht 61.0 in | Wt 150.0 lb

## 2014-03-20 DIAGNOSIS — D571 Sickle-cell disease without crisis: Secondary | ICD-10-CM

## 2014-03-20 DIAGNOSIS — N979 Female infertility, unspecified: Secondary | ICD-10-CM | POA: Diagnosis not present

## 2014-03-24 DIAGNOSIS — N979 Female infertility, unspecified: Secondary | ICD-10-CM | POA: Insufficient documentation

## 2014-03-24 NOTE — Progress Notes (Signed)
Patient ID: [name], female   DOB: 07/27/1980, 33 y.o.   MRN: [id number]   [name], is a 33 y.o. female  [id number]  [id number]  DOB - 07/27/1980  CC:  Chief Complaint  Patient presents with  . Follow-up  . Fatigue  . Infertility  . Pain       HPI: [name] is a 33 y.o. female here today to follow up on Hb SS. On her last visit she had SOB which was felt to be associated with anemia. However a PE could not be r/o as she had an elevated D-dimer. A CT angiogram was performed and was negative for PE.   She reports that the SOB is much improved since last visit. However she continues to feel more fatigued than when she is on the Hydrea.   With regard to Sickle Cell Disease, her pain has been more than usual since being off hte Hydrea. She has managed it well with NSAIDS and intermittent short acting opiates.  She is also concerned about her inability to conceive. She has been without contraceptives for the last year and has not been successful in conception. She continues to have regular menstrual periods.    Patient has No headache, No chest pain, No abdominal pain - No Nausea, No new weakness tingling or numbness, No Cough - SOB.  No Known Allergies Past Medical History  Diagnosis Date  . Hemoglobin S-S disease   . Ovarian cyst   . Jaundice   . Anemia    Current Outpatient Prescriptions on File Prior to Visit  Medication Sig Dispense Refill  . Cholecalciferol (VITAMIN D) 2000 UNITS CAPS Take 1 capsule (2,000 Units total) by mouth daily. 30 capsule 6  . ferrous sulfate 325 (65 FE) MG EC tablet Take 1 tablet (325 mg total) by mouth 2 (two) times daily at 10 AM and 5 PM. 60 tablet 0  . folic acid (FOLVITE) 1 MG tablet Take 2 tablets (2 mg total) by mouth daily. 60 tablet 4  . ibuprofen (ADVIL,MOTRIN) 800 MG tablet Take 1 tablet (800 mg total) by mouth every 8 (eight) hours as needed for pain. 30 tablet 0  . oxyCODONE (OXY IR/ROXICODONE) 5 MG  immediate release tablet Take 1 tablet (5 mg total) by mouth every 4 (four) hours as needed for severe pain. Take 1/2 tab to 1 tab every 4-6 hours for pain as needed 60 tablet 0  . hydroxyurea (HYDREA) 500 MG capsule Take 3 capsules (1,500 mg total) by mouth daily. (Patient not taking: Reported on 03/20/2014) 270 capsule 3   No current facility-administered medications on file prior to visit.   Family History  Problem Relation Age of Onset  . Hypertension Mother   . Hypertension Father    History   Social History  . Marital Status: Married    Spouse Name: N/A    Number of Children: N/A  . Years of Education: N/A   Occupational History  . Not on file.   Social History Main Topics  . Smoking status: Never Smoker   . Smokeless tobacco: Not on file  . Alcohol Use: No  . Drug Use: No  . Sexual Activity:    Partners: Male    Birth Control/ Protection: Condom   Other Topics Concern  . Not on file   Social History Narrative    Review of Systems: Constitutional: Negative for fever, chills, diaphoresis, activity change, appetite change and fatigue. HENT: Negative for ear pain, nosebleeds, congestion, facial swelling,  rhinorrhea, neck pain, neck stiffness and ear discharge.  Eyes: Negative for pain, discharge, redness, itching and visual disturbance. Respiratory: Negative for cough, choking, chest tightness, shortness of breath, wheezing and stridor.  Cardiovascular: Negative for chest pain, palpitations and leg swelling. Gastrointestinal: Negative for abdominal distention. Genitourinary: Negative for dysuria, urgency, frequency, hematuria, flank pain, decreased urine volume, difficulty urinating and dyspareunia.  Musculoskeletal: Negative for back pain, joint swelling, arthralgia and gait problem. Neurological: Negative for dizziness, tremors, seizures, syncope, facial asymmetry, speech difficulty, weakness, light-headedness, numbness and headaches.  Hematological: Negative for  adenopathy. Does not bruise/bleed easily. Psychiatric/Behavioral: Negative for hallucinations, behavioral problems, confusion, dysphoric mood, decreased concentration and agitation.     Objective:         Filed Vitals:   03/20/14 1631  BP: 134/72  Pulse: 92  Temp: 98.2 F (36.8 C)  Resp: 18    Physical Exam: Constitutional: Patient appears well-developed and well-nourished. No distress. HENT: Normocephalic, atraumatic, External right and left ear normal. Oropharynx is clear and moist.  Eyes: Conjunctivae and EOM are normal. PERRLA, mild scleral icterus. Neck: Normal ROM. Neck supple. No JVD. No tracheal deviation. No thyromegaly. CVS: RRR, S1/S2 +, no murmurs, no gallops, no carotid bruit.  Pulmonary: Effort and breath sounds normal, no stridor, rhonchi, wheezes, rales.  Abdominal: Soft. BS +, no distension, tenderness, rebound or guarding.  Musculoskeletal: Normal range of motion. No edema and no tenderness.  Lymphadenopathy: No lymphadenopathy noted, cervical, inguinal or axillary Neuro: Alert. Normal reflexes, muscle tone coordination. No cranial nerve deficit. Skin: Skin is warm and dry. No rash noted. Not diaphoretic. No erythema. No pallor. Psychiatric: Normal mood and affect. Behavior, judgment, thought content normal. Genitalia: External genitalia normal. Vaginal vault shows healthy tissue on visual inspection. No foul smelling discharge. Pt exhibits good lubrication with physiological discharge. No adnexal tenderness noted however there is a soft tissue mass felt on the anterior left vaginal vault.   Lab Results  Component Value Date   WBC 5.5 02/09/2014   HGB 7.6* 02/09/2014   HCT 22.0* 02/09/2014   MCV 84.6 02/09/2014   PLT 367 02/09/2014   Lab Results  Component Value Date   CREATININE 0.51 08/01/2013   BUN 9 08/01/2013   NA 136 08/01/2013   K 4.4 08/01/2013   CL 104 08/01/2013   CO2 24 08/01/2013    No results found for: HGBA1C Lipid Panel      Component Value Date/Time   CHOL 122 02/16/2014 0859   TRIG 60 02/16/2014 0859   HDL 43 02/16/2014 0859   CHOLHDL 2.8 02/16/2014 0859   VLDL 12 02/16/2014 0859   LDLCALC 67 02/16/2014 0859       Assessment and plan:   1. Hb-SS disease without crisis - Pt has had an increased anemia with evidence of increased hemolysis. She initially had DOE but appears to have compensated to the anemia. There is no indication for serial transfusions at this time. We discussed hydrea and she wants to continue to try to conceive while her husband is home from school for the holidays ( Husband studying in [place]). If no conception will resume Hydrea after he leaves.  2. Pain - Pt is doing well with managing her pain with NSAIDS and short acting Opiates.  3.  Infertility, female - We have discussed the issue of infertility and I have referred patietn back t her Ob-Gyn for evaluation for infertility  4. Fatigue - Pt had a full work-up and it is felt that the fatigue  is secondary to the anemia.    Follow-up in 1 month  The patient was given clear instructions to go to ER or return to medical center if symptoms don't improve, worsen or new problems develop. The patient verbalized understanding. The patient was told to call to get lab results if they haven't heard anything in the next week.     This note has been created with Education officer, environmentalDragon speech recognition software and smart phrase technology. Any transcriptional errors are unintentional.    Alwyn Cordner A., MD [facility] [place], [place] [phone]   03/24/2014, 2:21 PM

## 2014-04-24 ENCOUNTER — Telehealth (HOSPITAL_COMMUNITY): Payer: Self-pay | Admitting: Internal Medicine

## 2014-04-24 ENCOUNTER — Telehealth (HOSPITAL_COMMUNITY): Payer: Self-pay

## 2014-04-24 NOTE — Telephone Encounter (Signed)
Close encounter 

## 2014-04-24 NOTE — Telephone Encounter (Signed)
Pt arrived at day hospital and states having pain in right chest and waist; states pain level is 5/10; states that she thinks that she needs hydration and has not been taking her hydroxyurea; pt had called earlier in the day stating pain in chest and had been notified to go to the ED for evaluation; Dr. Nathanial MillmanAgboola notified; informed pt, per MD, that she would need to go to the ED for evaluation; pt states that she does not want to go to the ED and states that she may call the day hospital in the AM; reiterated to pt that, per MD, she should go to the ED for evaluation; pt verbalizes understanding

## 2014-04-24 NOTE — Telephone Encounter (Signed)
Pt called and states been experiencing chest pain x 3 days and pain when taking deep breaths; states taken oxycodone at 10 pm, 1/17 for pain; notified MD; pt informed that she needs to go to the ED for evaluation; pt verbalizes understanding

## 2014-04-25 ENCOUNTER — Encounter: Payer: Self-pay | Admitting: Internal Medicine

## 2014-04-27 ENCOUNTER — Other Ambulatory Visit: Payer: Self-pay | Admitting: Internal Medicine

## 2014-04-27 DIAGNOSIS — D571 Sickle-cell disease without crisis: Secondary | ICD-10-CM

## 2014-04-27 MED ORDER — HYDROXYUREA 500 MG PO CAPS: 500.0000 mg | ORAL_CAPSULE | Freq: Every day | ORAL | Status: DC

## 2014-04-27 NOTE — Progress Notes (Signed)
If she did not have labs performed before starting Hydrea then please come in for labs today.

## 2014-04-27 NOTE — Progress Notes (Signed)
Dr. Ashley RoyaltyMatthews [facility] when dr. Okey Duprerawford gave her the go ahead. Do you want her to come in for labs today or in 2 weeks? Please advise. Thanks!

## 2014-04-27 NOTE — Progress Notes (Unsigned)
Received note from patient requesting to resume Hydrea after [facility] appointment [facility]. I reviewed notes from [facility] in [facility] Everywhere and confirmed advice to resume Hydrea.  I have ordered baseline labs including CBC with diff, CMET, reticulocytes and electrophoresis. I have also ordered Hydrea 500 mg daily. The plan is to recheck labs in 2 weeks and if okay will increase to 1000 mg daily and check fetal Hb levels in 6 weeks.

## 2014-04-27 NOTE — Progress Notes (Signed)
If she did not have labs done with Dr. Okey Duprerawford then please come in for labs today.

## 2014-05-04 ENCOUNTER — Telehealth: Payer: Self-pay | Admitting: Internal Medicine

## 2014-05-04 NOTE — Telephone Encounter (Signed)
Patient called regarding name change. Changed name in EPIC to reflect married name and contacted [facility] with corrected information for billing purposes.

## 2014-05-18 ENCOUNTER — Other Ambulatory Visit (INDEPENDENT_AMBULATORY_CARE_PROVIDER_SITE_OTHER): Payer: Medicaid Other

## 2014-05-18 DIAGNOSIS — D571 Sickle-cell disease without crisis: Secondary | ICD-10-CM | POA: Diagnosis not present

## 2014-05-18 LAB — COMPREHENSIVE METABOLIC PANEL
ALBUMIN: 4.3 g/dL (ref 3.5–5.2)
ALK PHOS: 52 U/L (ref 39–117)
ALT: 11 U/L (ref 0–35)
AST: 35 U/L (ref 0–37)
BILIRUBIN TOTAL: 6.7 mg/dL — AB (ref 0.2–1.2)
BUN: 9 mg/dL (ref 6–23)
CALCIUM: 9.1 mg/dL (ref 8.4–10.5)
CO2: 25 mEq/L (ref 19–32)
CREATININE: 0.51 mg/dL (ref 0.50–1.10)
Chloride: 104 mEq/L (ref 96–112)
Glucose, Bld: 96 mg/dL (ref 70–99)
Potassium: 4.5 mEq/L (ref 3.5–5.3)
SODIUM: 138 meq/L (ref 135–145)
TOTAL PROTEIN: 7.5 g/dL (ref 6.0–8.3)

## 2014-05-19 LAB — CBC WITH DIFFERENTIAL/PLATELET
BASOS PCT: 1 % (ref 0–1)
Basophils Absolute: 0 10*3/uL (ref 0.0–0.1)
Eosinophils Absolute: 0 10*3/uL (ref 0.0–0.7)
Eosinophils Relative: 1 % (ref 0–5)
HCT: 23.7 % — ABNORMAL LOW (ref 36.0–46.0)
Hemoglobin: 7.9 g/dL — ABNORMAL LOW (ref 12.0–15.0)
LYMPHS PCT: 42 % (ref 12–46)
Lymphs Abs: 2 10*3/uL (ref 0.7–4.0)
MCH: 28.8 pg (ref 26.0–34.0)
MCHC: 33.3 g/dL (ref 30.0–36.0)
MCV: 86.5 fL (ref 78.0–100.0)
MONO ABS: 0.4 10*3/uL (ref 0.1–1.0)
MONOS PCT: 8 % (ref 3–12)
MPV: 8.4 fL — ABNORMAL LOW (ref 8.6–12.4)
NEUTROS PCT: 48 % (ref 43–77)
Neutro Abs: 2.3 10*3/uL (ref 1.7–7.7)
Platelets: 366 10*3/uL (ref 150–400)
RBC: 2.74 MIL/uL — ABNORMAL LOW (ref 3.87–5.11)
RDW: 25.6 % — ABNORMAL HIGH (ref 11.5–15.5)
WBC: 4.7 10*3/uL (ref 4.0–10.5)

## 2014-05-19 LAB — RETICULOCYTES
ABS RETIC: 274 10*3/uL — AB (ref 19.0–186.0)
RBC.: 2.74 MIL/uL — ABNORMAL LOW (ref 3.87–5.11)
RETIC CT PCT: 10 % — AB (ref 0.4–2.3)

## 2014-05-22 LAB — HEMOGLOBINOPATHY EVALUATION
HGB F QUANT: 9.3 % — AB (ref 0.0–2.0)
Hemoglobin Other: 0 %
Hgb A2 Quant: 3.3 % — ABNORMAL HIGH (ref 2.2–3.2)
Hgb A: 0 % — ABNORMAL LOW (ref 96.8–97.8)
Hgb S Quant: 87.4 % — ABNORMAL HIGH

## 2014-07-13 ENCOUNTER — Other Ambulatory Visit: Payer: Self-pay | Admitting: Internal Medicine

## 2014-07-13 DIAGNOSIS — D571 Sickle-cell disease without crisis: Secondary | ICD-10-CM

## 2014-07-14 ENCOUNTER — Other Ambulatory Visit: Payer: BLUE CROSS/BLUE SHIELD

## 2014-07-14 DIAGNOSIS — D571 Sickle-cell disease without crisis: Secondary | ICD-10-CM

## 2014-07-15 LAB — CBC WITH DIFFERENTIAL/PLATELET
BASOS ABS: 0.1 10*3/uL (ref 0.0–0.1)
BASOS PCT: 1 % (ref 0–1)
EOS ABS: 0.1 10*3/uL (ref 0.0–0.7)
Eosinophils Relative: 1 % (ref 0–5)
HCT: 24.2 % — ABNORMAL LOW (ref 36.0–46.0)
HEMOGLOBIN: 8.2 g/dL — AB (ref 12.0–15.0)
LYMPHS ABS: 2.5 10*3/uL (ref 0.7–4.0)
LYMPHS PCT: 33 % (ref 12–46)
MCH: 30.7 pg (ref 26.0–34.0)
MCHC: 33.9 g/dL (ref 30.0–36.0)
MCV: 90.6 fL (ref 78.0–100.0)
MONOS PCT: 7 % (ref 3–12)
MPV: 8.8 fL (ref 8.6–12.4)
Monocytes Absolute: 0.5 10*3/uL (ref 0.1–1.0)
NEUTROS ABS: 4.4 10*3/uL (ref 1.7–7.7)
Neutrophils Relative %: 58 % (ref 43–77)
PLATELETS: 304 10*3/uL (ref 150–400)
RBC: 2.67 MIL/uL — ABNORMAL LOW (ref 3.87–5.11)
RDW: 25.9 % — ABNORMAL HIGH (ref 11.5–15.5)
WBC: 7.5 10*3/uL (ref 4.0–10.5)

## 2014-07-15 LAB — RETICULOCYTES
ABS Retic: 165.5 10*3/uL (ref 19.0–186.0)
RBC.: 2.67 MIL/uL — AB (ref 3.87–5.11)
RETIC CT PCT: 6.2 % — AB (ref 0.4–2.3)

## 2014-09-14 ENCOUNTER — Ambulatory Visit (INDEPENDENT_AMBULATORY_CARE_PROVIDER_SITE_OTHER): Payer: BLUE CROSS/BLUE SHIELD | Admitting: Internal Medicine

## 2014-09-14 VITALS — BP 127/57 | HR 87 | Temp 98.4°F | Resp 16 | Ht 61.0 in | Wt 153.0 lb

## 2014-09-14 DIAGNOSIS — D571 Sickle-cell disease without crisis: Secondary | ICD-10-CM

## 2014-09-14 DIAGNOSIS — N979 Female infertility, unspecified: Secondary | ICD-10-CM

## 2014-09-14 LAB — POCT URINALYSIS DIP (DEVICE)
BILIRUBIN URINE: NEGATIVE
GLUCOSE, UA: NEGATIVE mg/dL
KETONES UR: NEGATIVE mg/dL
Leukocytes, UA: NEGATIVE
Nitrite: NEGATIVE
PROTEIN: NEGATIVE mg/dL
SPECIFIC GRAVITY, URINE: 1.01 (ref 1.005–1.030)
UROBILINOGEN UA: 0.2 mg/dL (ref 0.0–1.0)
pH: 6.5 (ref 5.0–8.0)

## 2014-09-15 LAB — CBC WITH DIFFERENTIAL/PLATELET
BASOS ABS: 0.1 10*3/uL (ref 0.0–0.1)
Basophils Relative: 1 % (ref 0–1)
EOS ABS: 0.1 10*3/uL (ref 0.0–0.7)
Eosinophils Relative: 1 % (ref 0–5)
HEMATOCRIT: 23.5 % — AB (ref 36.0–46.0)
Hemoglobin: 8.1 g/dL — ABNORMAL LOW (ref 12.0–15.0)
LYMPHS PCT: 38 % (ref 12–46)
Lymphs Abs: 2.7 10*3/uL (ref 0.7–4.0)
MCH: 30.6 pg (ref 26.0–34.0)
MCHC: 34.5 g/dL (ref 30.0–36.0)
MCV: 88.7 fL (ref 78.0–100.0)
MONO ABS: 0.4 10*3/uL (ref 0.1–1.0)
MONOS PCT: 6 % (ref 3–12)
MPV: 9.5 fL (ref 8.6–12.4)
NEUTROS PCT: 54 % (ref 43–77)
Neutro Abs: 3.9 10*3/uL (ref 1.7–7.7)
PLATELETS: 437 10*3/uL — AB (ref 150–400)
RBC: 2.65 MIL/uL — AB (ref 3.87–5.11)
RDW: 24.2 % — ABNORMAL HIGH (ref 11.5–15.5)
WBC: 7.2 10*3/uL (ref 4.0–10.5)

## 2014-09-15 LAB — BASIC METABOLIC PANEL
BUN: 16 mg/dL (ref 6–23)
CO2: 24 meq/L (ref 19–32)
CREATININE: 0.57 mg/dL (ref 0.50–1.10)
Calcium: 9 mg/dL (ref 8.4–10.5)
Chloride: 104 mEq/L (ref 96–112)
GLUCOSE: 100 mg/dL — AB (ref 70–99)
Potassium: 4.1 mEq/L (ref 3.5–5.3)
SODIUM: 136 meq/L (ref 135–145)

## 2014-09-15 LAB — RETICULOCYTES
ABS Retic: 355.1 10*3/uL — ABNORMAL HIGH (ref 19.0–186.0)
RBC.: 2.65 MIL/uL — ABNORMAL LOW (ref 3.87–5.11)
Retic Ct Pct: 13.4 % — ABNORMAL HIGH (ref 0.4–2.3)

## 2014-09-15 LAB — LACTATE DEHYDROGENASE: LDH: 421 U/L — AB (ref 94–250)

## 2014-09-20 ENCOUNTER — Encounter: Payer: Self-pay | Admitting: Internal Medicine

## 2014-09-20 NOTE — Progress Notes (Signed)
Patient ID: [name], female   DOB: 07/24/1980, 34 y.o.   MRN: [id number]   [name], is a 34 y.o. female  KFE:[id number]  [id number]  DOB - 23-Dec-1980  CC:  Chief Complaint  Patient presents with  . Follow-up    scd since stoping hydrea        HPI: [name] is a 34 y.o. female here today to follow up on Hb SS disease. Pt and spouse have also been attempting to conceive and have been unsuccessful. We have discussed fertility evaluation but she has not seen her Ob-Gyn recently.   With regard to SCD, she has continued to have some degree of pain since she has been off off of the Hydrea (attempting to conceive). We have discussed pain management and she does not use opiates except in extreme cases of pain. She has been trying to manage with NSAID's. I have advised her that when she does conceive she will be unable to use NSAID's at least in the 1st Trimester and maybe even for a longer period during the pregnancy at the clinical discretion of her Ob-Gyn.    Patient has No headache, No chest pain, No abdominal pain - No Nausea, No new weakness tingling or numbness, No Cough - SOB.  No Known Allergies Past Medical History  Diagnosis Date  . Hemoglobin S-S disease   . Ovarian cyst   . Jaundice   . Anemia    Current Outpatient Prescriptions on File Prior to Visit  Medication Sig Dispense Refill  . Cholecalciferol (VITAMIN D) 2000 UNITS CAPS Take 1 capsule (2,000 Units total) by mouth daily. 30 capsule 6  . folic acid (FOLVITE) 1 MG tablet Take 2 tablets (2 mg total) by mouth daily. 60 tablet 4  . ibuprofen (ADVIL,MOTRIN) 800 MG tablet Take 1 tablet (800 mg total) by mouth every 8 (eight) hours as needed for pain. 30 tablet 0  . oxyCODONE (OXY IR/ROXICODONE) 5 MG immediate release tablet Take 1 tablet (5 mg total) by mouth every 4 (four) hours as needed for severe pain. Take 1/2 tab to 1 tab every 4-6 hours for pain as needed 60 tablet 0  .  ferrous sulfate 325 (65 FE) MG EC tablet Take 1 tablet (325 mg total) by mouth 2 (two) times daily at 10 AM and 5 PM. (Patient not taking: Reported on 09/14/2014) 60 tablet 0  . hydroxyurea (HYDREA) 500 MG capsule Take 1 capsule (500 mg total) by mouth daily. (Patient not taking: Reported on 09/14/2014) 270 capsule 1   No current facility-administered medications on file prior to visit.   Family History  Problem Relation Age of Onset  . Hypertension Mother   . Hypertension Father    History   Social History  . Marital Status: Married    Spouse Name: N/A  . Number of Children: N/A  . Years of Education: N/A   Occupational History  . Not on file.   Social History Main Topics  . Smoking status: Never Smoker   . Smokeless tobacco: Not on file  . Alcohol Use: No  . Drug Use: No  . Sexual Activity:    Partners: Male    Birth Control/ Protection: Condom   Other Topics Concern  . Not on file   Social History Narrative    Review of Systems: Constitutional: Negative for fever, chills, diaphoresis, activity change, appetite change and fatigue. HENT: Negative for ear pain, nosebleeds, congestion, facial swelling, rhinorrhea, neck pain, neck stiffness and ear discharge.  Eyes: Negative for pain, discharge, redness, itching and visual disturbance. Respiratory: Negative for cough, choking, chest tightness, shortness of breath, wheezing and stridor.  Cardiovascular: Negative for chest pain, palpitations and leg swelling. Gastrointestinal: Negative for abdominal distention. Genitourinary: Negative for dysuria, urgency, frequency, hematuria, flank pain, decreased urine volume, difficulty urinating and dyspareunia.  Musculoskeletal: Negative for back pain, joint swelling, arthralgia and gait problem. Neurological: Negative for dizziness, tremors, seizures, syncope, facial asymmetry, speech difficulty, weakness, light-headedness, numbness and headaches.  Hematological: Negative for adenopathy.  Does not bruise/bleed easily. Psychiatric/Behavioral: Negative for hallucinations, behavioral problems, confusion, dysphoric mood, decreased concentration and agitation.     Objective:   Filed Vitals:   09/14/14 1510  BP: 127/57  Pulse: 87  Temp: 98.4 F (36.9 C)  Resp: 16    Physical Exam: Constitutional: Patient appears well-developed and well-nourished. No distress. HENT: Normocephalic, atraumatic, External right and left ear normal. Oropharynx is clear and moist.  Eyes: Conjunctivae and EOM are normal. PERRLA, no scleral icterus. Neck: Normal ROM. Neck supple. No JVD. No tracheal deviation. No thyromegaly. CVS: RRR, S1/S2 +, no murmurs, no gallops, no carotid bruit.  Pulmonary: Effort and breath sounds normal, no stridor, rhonchi, wheezes, rales.  Abdominal: Soft. BS +, no distension, tenderness, rebound or guarding.  Musculoskeletal: Normal range of motion. No edema and no tenderness.  Lymphadenopathy: No lymphadenopathy noted, cervical, inguinal or axillary Neuro: Alert. Normal reflexes, muscle tone coordination. No cranial nerve deficit. Skin: Skin is warm and dry. No rash noted. Not diaphoretic. No erythema. No pallor. Psychiatric: Normal mood and affect. Behavior, judgment, thought content normal. Genitalia: External genitalia normal. Vaginal vault shows healthy tissue on visual inspection. No foul smelling discharge. Pt exhibits good lubrication with physiological discharge. No adnexal tenderness noted however there is a soft tissue mass felt on the anterior left vaginal vault.   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 She has been off for several months and used barrier protection for 3 months after stopping. She and her husband are now [place] trying to conceive. We discussed the need for good hydration, monitoring of hydration status, avoidance of heat, cold, stress, and infection triggers.  Continue folic acid 2 mg daily to prevent aplastic bone marrow crises and prepare for conception. She is following periodically (about 2 x year) with [facility].  2. Pulmonary evaluation - Patient denies severe recurrent wheezes, shortness of breath with exercise, or persistent cough. If these symptoms develop, pulmonary function tests with spirometry will be ordered, and if abnormal, plan on referral to Pulmonology for further evaluation.  3. Cardiac - Routine screening for pulmonary hypertension is not recommended.  4. Eye -  High risk of proliferative retinopathy. Annual eye exam with retinal exam recommended to patient. She has an appointment scheduled and I have asked her to bring [place] the results.  5. Immunization status - She is up to date with Meningococcal and Pneumococcal vaccines. Will nee influenza vaccine when season begins.  6. Acute and chronic painful episodes - Pt is of low opoid risk. She manages her pain mainly with Ibuprofen. Her last opoid prescription was dispensed in November of 2015.   We discussed that pt is to receive her Schedule II prescriptions only from [place]. Pt is also aware that the prescription history is available to [place] online through the [facility] CSRS. Controlled substance agreement signed (Date). We reminded (Pt) that all patients receiving Schedule II narcotics must be seen for follow  within one month of prescription being requested. We reviewed the terms of our pain agreement, including the need to keep medicines in a safe locked location away from children or pets, and the need to report excess sedation or constipation, measures to avoid constipation, and policies related to early refills and stolen prescriptions. According to the [place] [facility] program, we have reviewed details related to analgesia, adverse effects, aberrant behaviors.  7. Iron overload from chronic transfusion.  Pt has had only very infrequent transfusions and has no iron overload.   8. Vitamin D deficiency - She continues on Vitamin D OTC.   The above recommendations are taken from the NIH Evidence-Based Management of Sickle Cell Disease: Expert Panel Report, 2014.    - CBC with Differential/Platelet - Reticulocytes - Basic Metabolic Panel - Lactate dehydrogenase - Urinalysis, Routine w reflex microscopic (not at [facility]); Standing - Urinalysis, Routine w reflex microscopic (not at [facility])  2. Female infertility - Will refer to Dr. Chevis Pretty- Fertility specialist - Ambulatory referral to Infertility   Follow-up PRN or in 6 months.  The patient was given clear instructions to go to ER or return to medical center if symptoms don't improve, worsen or new problems develop. The patient verbalized understanding. The patient was told to call to get lab results if they haven't heard anything in the next week.     This note has been created with Education officer, environmental. Any transcriptional errors are unintentional.    Jacobus Colvin A., MD [facility] [place], [place] [phone]   09/20/2014, 8:42 AM

## 2014-10-14 ENCOUNTER — Emergency Department (HOSPITAL_COMMUNITY)
Admission: EM | Admit: 2014-10-14 | Discharge: 2014-10-15 | Disposition: A | Discharge status: Home / Self Care | Payer: BLUE CROSS/BLUE SHIELD | Attending: Emergency Medicine | Admitting: Emergency Medicine

## 2014-10-14 ENCOUNTER — Encounter (HOSPITAL_COMMUNITY): Payer: Self-pay

## 2014-10-14 DIAGNOSIS — M545 Low back pain, unspecified: Secondary | ICD-10-CM

## 2014-10-14 DIAGNOSIS — Z8742 Personal history of other diseases of the female genital tract: Secondary | ICD-10-CM | POA: Insufficient documentation

## 2014-10-14 DIAGNOSIS — D649 Anemia, unspecified: Secondary | ICD-10-CM | POA: Insufficient documentation

## 2014-10-14 DIAGNOSIS — D5700 Hb-SS disease with crisis, unspecified: Secondary | ICD-10-CM

## 2014-10-14 DIAGNOSIS — Z3202 Encounter for pregnancy test, result negative: Secondary | ICD-10-CM | POA: Diagnosis not present

## 2014-10-14 DIAGNOSIS — R17 Unspecified jaundice: Secondary | ICD-10-CM | POA: Insufficient documentation

## 2014-10-14 DIAGNOSIS — Z79899 Other long term (current) drug therapy: Secondary | ICD-10-CM | POA: Insufficient documentation

## 2014-10-14 LAB — COMPREHENSIVE METABOLIC PANEL
ALBUMIN: 4.3 g/dL (ref 3.5–5.0)
ALT: 19 U/L (ref 14–54)
AST: 53 U/L — AB (ref 15–41)
Alkaline Phosphatase: 64 U/L (ref 38–126)
Anion gap: 7 (ref 5–15)
BUN: 12 mg/dL (ref 6–20)
CALCIUM: 9.1 mg/dL (ref 8.9–10.3)
CO2: 25 mmol/L (ref 22–32)
Chloride: 106 mmol/L (ref 101–111)
Creatinine, Ser: <0.3 mg/dL — ABNORMAL LOW (ref 0.44–1.00)
Glucose, Bld: 114 mg/dL — ABNORMAL HIGH (ref 65–99)
Potassium: 4.4 mmol/L (ref 3.5–5.1)
Sodium: 138 mmol/L (ref 135–145)
Total Bilirubin: 5.3 mg/dL — ABNORMAL HIGH (ref 0.3–1.2)
Total Protein: 8.1 g/dL (ref 6.5–8.1)

## 2014-10-14 LAB — RETICULOCYTES
RBC.: 2.61 MIL/uL — ABNORMAL LOW (ref 3.87–5.11)
RETIC COUNT ABSOLUTE: 548.1 10*3/uL — AB (ref 19.0–186.0)
RETIC CT PCT: 21 % — AB (ref 0.4–3.1)

## 2014-10-14 LAB — URINALYSIS, ROUTINE W REFLEX MICROSCOPIC
Bilirubin Urine: NEGATIVE
Glucose, UA: NEGATIVE mg/dL
Ketones, ur: NEGATIVE mg/dL
LEUKOCYTES UA: NEGATIVE
Nitrite: NEGATIVE
PROTEIN: NEGATIVE mg/dL
Specific Gravity, Urine: 1.005 (ref 1.005–1.030)
Urobilinogen, UA: 0.2 mg/dL (ref 0.0–1.0)
pH: 6.5 (ref 5.0–8.0)

## 2014-10-14 LAB — CBC WITH DIFFERENTIAL/PLATELET
BASOS ABS: 0.1 10*3/uL (ref 0.0–0.1)
Basophils Relative: 1 % (ref 0–1)
Eosinophils Absolute: 0.1 10*3/uL (ref 0.0–0.7)
Eosinophils Relative: 1 % (ref 0–5)
HCT: 22.4 % — ABNORMAL LOW (ref 36.0–46.0)
Hemoglobin: 7.9 g/dL — ABNORMAL LOW (ref 12.0–15.0)
LYMPHS ABS: 3.2 10*3/uL (ref 0.7–4.0)
Lymphocytes Relative: 33 % (ref 12–46)
MCH: 30.3 pg (ref 26.0–34.0)
MCHC: 35.3 g/dL (ref 30.0–36.0)
MCV: 85.8 fL (ref 78.0–100.0)
Monocytes Absolute: 0.7 10*3/uL (ref 0.1–1.0)
Monocytes Relative: 7 % (ref 3–12)
NEUTROS PCT: 58 % (ref 43–77)
Neutro Abs: 5.5 10*3/uL (ref 1.7–7.7)
Platelets: 348 10*3/uL (ref 150–400)
RBC: 2.61 MIL/uL — ABNORMAL LOW (ref 3.87–5.11)
RDW: 27.4 % — ABNORMAL HIGH (ref 11.5–15.5)
WBC: 9.6 10*3/uL (ref 4.0–10.5)

## 2014-10-14 LAB — URINE MICROSCOPIC-ADD ON

## 2014-10-14 MED ORDER — SODIUM CHLORIDE 0.9 % IV SOLN
INTRAVENOUS | Status: DC
  Administered 2014-10-14: 23:00:00 via INTRAVENOUS

## 2014-10-14 NOTE — ED Provider Notes (Signed)
CSN: [id number]     Arrival date & time 10/14/14  2153 History   First MD Initiated Contact with Patient 10/14/14 2210     Chief Complaint  Patient presents with  . Sickle Cell Pain Crisis     (Consider location/radiation/quality/duration/timing/severity/associated sxs/prior Treatment) HPI Comments: [name] is a 34 y.o. female with a PMHx of Hgb SS disease, jaundice, anemia, and ovarian cysts, with a PSHx of cholecystectomy, who presents to the ED with complaints of sickle cell crisis presenting as low back pain 1 day. She states is the same as her typical sickle cell pain. She reports the pain is 8/10 low back intermittent throbbing nonradiating pain worse with movement and improved with oxycodone which she took last at 8:30 PM. She feels this pain medication is starting to kick in and relieve her back pain. She denies any recent travel or changes in elevation her temperature, but she states that she has been off her hydroxyurea for a few months because she is trying to have a baby, and that she is currently on her menses which started 2 days ago. She also reports that she feels that she has not been adequately hydrating and this could be causing her symptoms. She denies any fevers, chills, chest pain, shortness breath, history of PE, cough, URI symptoms, abdominal pain, nausea, vomiting, diarrhea, constipation, melena, hematochezia, dysuria, hematuria, vaginal discharge, numbness, tingling, weakness, leg swelling, or leg ulcers. Her PCP is Marthann Schiller. Her typical hemoglobin count is 6.6. Her last transfusion was approximately 10 years ago. She has very infrequent pain attacks. Very remote hx of acute chest 12yrs ago.  Patient is a 34 y.o. female presenting with sickle cell pain. The history is provided by the patient. No language interpreter was used.  Sickle Cell Pain Crisis Location:  Back Severity:  Moderate Onset quality:  Gradual Duration:  1 day Similar to previous  crisis episodes: yes   Timing:  Intermittent Progression:  Waxing and waning Chronicity:  Recurrent Sickle cell genotype:  SS Usual hemoglobin level:  7.6-8.6 Date of last transfusion:  36yrs ago Frequency of attacks:  Infrequent History of pulmonary emboli: no   Context: change in medication, dehydration and menses   Context: not cold exposure   Relieved by:  Prescription drugs Worsened by:  Movement Ineffective treatments:  None tried Associated symptoms: no chest pain, no congestion, no cough, no fever, no headaches, no leg ulcers, no nausea, no shortness of breath, no sore throat, no swelling of legs, no vomiting and no wheezing   Risk factors: prior acute chest (74yrs ago)   Risk factors: no elevation change, no frequent admissions for fever, no frequent admissions for pain, no frequent pain crises and no recent air travel     Past Medical History  Diagnosis Date  . Hemoglobin S-S disease   . Ovarian cyst   . Jaundice   . Anemia    Past Surgical History  Procedure Laterality Date  . Cholecystectomy  2001   Family History  Problem Relation Age of Onset  . Hypertension Mother   . Hypertension Father    History  Substance Use Topics  . Smoking status: Never Smoker   . Smokeless tobacco: Not on file  . Alcohol Use: No   OB History    Gravida Para Term Preterm AB TAB SAB Ectopic Multiple Living       Review of Systems  Constitutional: Negative for fever and chills.  HENT: Negative for congestion, ear discharge, ear pain, rhinorrhea and sore throat.   Respiratory: Negative for cough, shortness of breath and wheezing.   Cardiovascular: Negative for chest pain and leg swelling.  Gastrointestinal: Negative for nausea, vomiting, abdominal pain, diarrhea, constipation and blood in stool.  Genitourinary: Positive for vaginal bleeding (on menses). Negative for dysuria, hematuria, flank pain and vaginal discharge.  Musculoskeletal: Positive for back  pain. Negative for myalgias and arthralgias.  Skin: Negative for color change.  Allergic/Immunologic: Negative for immunocompromised state.  Neurological: Negative for weakness, numbness and headaches.  Psychiatric/Behavioral: Negative for confusion.   10 Systems reviewed and are negative for acute change except as noted in the HPI.    Allergies  Review of patient's allergies indicates no known allergies.  Home Medications   Prior to Admission medications   Medication Sig Start Date End Date Taking? Authorizing Provider  Cholecalciferol (VITAMIN D) 2000 UNITS CAPS Take 1 capsule (2,000 Units total) by mouth daily. 02/09/14   Altha HarmMichelle A Matthews, MD  ferrous sulfate 325 (65 FE) MG EC tablet Take 1 tablet (325 mg total) by mouth 2 (two) times daily at 10 AM and 5 PM. Patient not taking: Reported on 09/14/2014 02/09/14   Altha HarmMichelle A Matthews, MD  folic acid (FOLVITE) 1 MG tablet Take 2 tablets (2 mg total) by mouth daily. 02/09/14   Altha HarmMichelle A Matthews, MD  hydroxyurea (HYDREA) 500 MG capsule Take 1 capsule (500 mg total) by mouth daily. Patient not taking: Reported on 09/14/2014 04/27/14   Altha HarmMichelle A Matthews, MD  ibuprofen (ADVIL,MOTRIN) 800 MG tablet Take 1 tablet (800 mg total) by mouth every 8 (eight) hours as needed for pain. 12/21/12   Altha HarmMichelle A Matthews, MD  Multiple Vitamin (MULTIVITAMIN) tablet Take 1 tablet by mouth daily.    Historical Provider, MD  oxyCODONE (OXY IR/ROXICODONE) 5 MG immediate release tablet Take 1 tablet (5 mg total) by mouth every 4 (four) hours as needed for severe pain. Take 1/2 tab to 1 tab every 4-6 hours for pain as needed 02/09/14   Altha HarmMichelle A Matthews, MD   BP 126/70 mmHg  Pulse 91  Temp(Src) 98.1 F (36.7 C) (Oral)  Resp 16  SpO2 100%  LMP 10/12/2014 (Exact Date) Physical Exam  Constitutional: She is oriented to person, place, and time. Vital signs are normal. She appears well-developed and well-nourished.  Non-toxic appearance. No distress.  Afebrile,  nontoxic, NAD  HENT:  Head: Normocephalic and atraumatic.  Mouth/Throat: Oropharynx is clear and moist and mucous membranes are normal.  Eyes: Conjunctivae and EOM are normal. Right eye exhibits no discharge. Left eye exhibits no discharge. Scleral icterus is present.  Scleral icterus  Neck: Normal range of motion. Neck supple.  Cardiovascular: Normal rate, regular rhythm, normal heart sounds and intact distal pulses.  Exam reveals no gallop and no friction rub.   No murmur heard. Pulmonary/Chest: Effort normal and breath sounds normal. No respiratory distress. She has no decreased breath sounds. She has no wheezes. She has no rhonchi. She has no rales.  Abdominal: Soft. Normal appearance and bowel sounds are normal. She exhibits no distension. There is no tenderness. There is no rigidity, no rebound, no guarding, no CVA tenderness, no tenderness at McBurney's point and negative Murphy's sign.  Musculoskeletal: Normal range of motion.       Lumbar back: She exhibits tenderness. She exhibits normal range of motion, no bony tenderness, no deformity and no spasm.       Back:  Lumbar spine with  FROM intact without spinous process TTP, no bony stepoffs or deformities, with mild b/l paraspinous muscle TTP, no muscle spasms. Strength 5/5 in all extremities, sensation grossly intact in all extremities, negative SLR bilaterally, gait steady and nonantalgic. No overlying skin changes.   Neurological: She is alert and oriented to person, place, and time. She has normal strength. No sensory deficit.  Skin: Skin is warm, dry and intact. No rash noted.  No pedal edema or LE ulcerations  Psychiatric: She has a normal mood and affect.  Nursing note and vitals reviewed.   ED Course  Procedures (including critical care time) Labs Review Labs Reviewed  CBC WITH DIFFERENTIAL/PLATELET - Abnormal; Notable for the following:    RBC 2.61 (*)    Hemoglobin 7.9 (*)    HCT 22.4 (*)    RDW 27.4 (*)    All other  components within normal limits  COMPREHENSIVE METABOLIC PANEL - Abnormal; Notable for the following:    Glucose, Bld 114 (*)    Creatinine, Ser <0.30 (*)    AST 53 (*)    Total Bilirubin 5.3 (*)    All other components within normal limits  RETICULOCYTES - Abnormal; Notable for the following:    Retic Ct Pct 21.0 (*)    RBC. 2.61 (*)    Retic Count, Manual 548.1 (*)    All other components within normal limits  URINALYSIS, ROUTINE W REFLEX MICROSCOPIC (NOT AT [facility]) - Abnormal; Notable for the following:    Hgb urine dipstick SMALL (*)    All other components within normal limits  URINE MICROSCOPIC-ADD ON  PREGNANCY, URINE    Imaging Review No results found.   EKG Interpretation None      MDM   Final diagnoses:  Sickle cell anemia with pain  Bilateral low back pain without sciatica  Hyperbilirubinemia    34 y.o. female here with sickle cell crisis pain, low back pain. States she took oxycodone approx an hr PTA and it seems to be kicking in now. Declines pain meds. On exam, no focal bony tenderness, gait steady, extremities neurovascularly intact, no red flag s/sx. No chest pain/shortness of breath, no hypoxia or tachycardia, and clear lung exam, doubt acute chest or PE. Will get labs, U/A, and Upreg, and give fluids since she feels this crisis is potentially due to dehydration. Will reassess shortly.   12:36 AM Pain continues to be greatly improved, pt feels well after 1L NS. U/A negative for infection, Upreg neg, CBC w/diff showing stable H/H at 7.9/22.4 respectively. CMP showing chronic hyperbilirubinemia, mildly elevated AST at 53. Retic count stable. Given improvement with her home narcotic and fluids, doubt need for admission at this time. Will have her f/up with her PCP in 3-5 days, discussed use of tylenol/motrin/home narcotics (has plenty of refills). Discussed importance of staying hydrated. I explained the diagnosis and have given explicit precautions to return to the  ER including for any other new or worsening symptoms. The patient understands and accepts the medical plan as it's been dictated and I have answered their questions. Discharge instructions concerning home care and prescriptions have been given. The patient is STABLE and is discharged to home in good condition.  BP 126/70 mmHg  Pulse 91  Temp(Src) 98.1 F (36.7 C) (Oral)  Resp 16  SpO2 100%  LMP 10/12/2014 (Exact Date)  Meds ordered this encounter  Medications  . 0.9 %  sodium chloride infusion    Sig:      Zander Ingham Camprubi-Soms, PA-C  10/15/14 0038  Toy Cookey, MD 10/15/14 1204

## 2014-10-14 NOTE — ED Notes (Addendum)
Pt c/o of 10/10 lower back r/t sickle cell hx. Pt reports taking Rx x1  Oxycodone at 20:30. Pt denies any back injury or trauma.

## 2014-10-14 NOTE — ED Notes (Signed)
EDP at bedside  

## 2014-10-15 LAB — PREGNANCY, URINE: PREG TEST UR: NEGATIVE

## 2014-10-15 NOTE — Discharge Instructions (Signed)
Stay well hydrated, use heat to the area of pain for 20 minutes every hour, use tylenol or motrin as needed for pain, and use your home oxycodone as needed for severe pain but don't drive while taking oxycodone. 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 Return to the ER for changes or worsening symptoms.   Back Pain, Adult Low back pain is very common. About 1 in 5 people have back pain.The cause of low back pain is rarely dangerous. The pain often gets better over time.About half of people with a sudden onset of back pain feel better in just 2 weeks. About 8 in 10 people feel better by 6 weeks.  CAUSES Some common causes of back pain include:  Strain of the muscles or ligaments supporting the spine.  Wear and tear (degeneration) of the spinal discs.  Arthritis.  Direct injury to the back. DIAGNOSIS Most of the time, the direct cause of low back pain is not known.However, back pain can be treated effectively even when the exact cause of the pain is unknown.Answering your caregiver's questions about your overall health and symptoms is one of the most accurate ways to make sure the cause of your pain is not dangerous. If your caregiver needs more information, he or she may order lab work or imaging tests (X-rays or MRIs).However, even if imaging tests show changes in your back, this usually does not require surgery. HOME CARE INSTRUCTIONS For many people, back pain returns.Since low back pain is rarely dangerous, it is often a condition that people can learn to [facility] their own.   Remain active. It is stressful on the back to sit or stand in one place. Do not sit, drive, or stand in one place for more than 30 minutes at a time. Take short walks on level surfaces as soon as pain allows.Try to increase the length of time you walk each day.  Do not stay in bed.Resting more than 1 or 2 days can delay your  recovery.  Do not avoid exercise or work.Your body is made to move.It is not dangerous to be active, even though your back may hurt.Your back will likely heal faster if you return to being active before your pain is gone.  Pay attention to your body when you bend and lift. Many people have less discomfortwhen lifting if they bend their knees, keep the load close to their bodies,and avoid twisting. Often, the most comfortable positions are those that put less stress on your recovering back.  Find a comfortable position to sleep. Use a firm mattress and lie on your side with your knees slightly bent. If you lie on your back, put a pillow under your knees.  Only take over-the-counter or prescription medicines as directed by your caregiver. Over-the-counter medicines to reduce pain and inflammation are often the most helpful.Your caregiver may prescribe muscle relaxant drugs.These medicines help dull your pain so you can more quickly return to your normal activities and healthy exercise.  Put ice on the injured area.  Put ice in a plastic bag.  Place a towel between your skin and the bag.  Leave the ice on for 15-20 minutes, 03-04 times a day for the first 2 to 3 days. After that, ice and heat may be alternated to reduce pain and spasms.  Ask your caregiver about trying back exercises and gentle massage. This may be of some benefit.  Avoid feeling  anxious or stressed.Stress increases muscle tension and can worsen back pain.It is important to recognize when you are anxious or stressed and learn ways to manage it.Exercise is a great option. SEEK MEDICAL CARE IF:  You have pain that is not relieved with rest or medicine.  You have pain that does not improve in 1 week.  You have new symptoms.  You are generally not feeling well. SEEK IMMEDIATE MEDICAL CARE IF:   You have pain that radiates from your back into your legs.  You develop new bowel or bladder control problems.  You  have unusual weakness or numbness in your arms or legs.  You develop nausea or vomiting.  You develop abdominal pain.  You feel faint. Document Released: 03/24/2005 Document Revised: 09/23/2011 Document Reviewed: 07/26/2013 [facility] Patient Information 2015 [place], [place]. This information is not intended to replace advice given to you by your health care provider. Make sure you discuss any questions you have with your health care provider.  Heat Therapy Heat therapy can help make painful, stiff muscles and joints feel better. Do not use heat on new injuries. Wait at least 48 hours after an injury to use heat. Do not use heat when you have aches or pains right after an activity. If you still have pain 3 hours after stopping the activity, then you may use heat. HOME CARE Wet heat pack  Soak a clean towel in warm water. Squeeze out the extra water.  Put the warm, wet towel in a plastic bag.  Place a thin, dry towel between your skin and the bag.  Put the heat pack on the area for 5 minutes, and check your skin. Your skin may be pink, but it should not be red.  Leave the heat pack on the area for 15 to 30 minutes.  Repeat this every 2 to 4 hours while awake. Do not use heat while you are sleeping. Warm water bath  Fill a tub with warm water.  Place the affected body part in the tub.  Soak the area for 20 to 40 minutes.  Repeat as needed. Hot water bottle  Fill the water bottle half full with hot water.  Press out the extra air. Close the cap tightly.  Place a dry towel between your skin and the bottle.  Put the bottle on the area for 5 minutes, and check your skin. Your skin may be pink, but it should not be red.  Leave the bottle on the area for 15 to 30 minutes.  Repeat this every 2 to 4 hours while awake. Electric heating pad  Place a dry towel between your skin and the heating pad.  Set the heating pad on low heat.  Put the heating pad on the area for 10  minutes, and check your skin. Your skin may be pink, but it should not be red.  Leave the heating pad on the area for 20 to 40 minutes.  Repeat this every 2 to 4 hours while awake.  Do not lie on the heating pad.  Do not fall asleep while using the heating pad.  Do not use the heating pad near water. GET HELP RIGHT AWAY IF:  You get blisters or red skin.  Your skin is puffy (swollen), or you lose feeling (numbness) in the affected area.  You have any new problems.  Your problems are getting worse.  You have any questions or concerns. If you have any problems, stop using heat therapy until you see  your doctor. MAKE SURE YOU:  Understand these instructions.  Will watch your condition.  Will get help right away if you are not doing well or get worse. Document Released: 06/16/2011 Document Reviewed: 05/17/2013 [facility] Patient Information 2015 [place], [place]. This information is not intended to replace advice given to you by your health care provider. Make sure you discuss any questions you have with your health care provider.  Sickle Cell Anemia, Adult Sickle cell anemia is a condition in which red blood cells have an abnormal "sickle" shape. This abnormal shape shortens the cells' life span, which results in a lower than normal concentration of red blood cells in the blood. The sickle shape also causes the cells to clump together and block free blood flow through the blood vessels. As a result, the tissues and organs of the body do not receive enough oxygen. Sickle cell anemia causes organ damage and pain and increases the risk of infection. CAUSES  Sickle cell anemia is a genetic disorder. Those who receive two copies of the gene have the condition, and those who receive one copy have the trait. RISK FACTORS The sickle cell gene is most common in people whose families originated in [place]. Other areas of the globe where sickle cell trait occurs include the [place], [place], the [place], and the [place].  SIGNS AND SYMPTOMS  Pain, especially in the extremities, back, chest, or abdomen (common). The pain may start suddenly or may develop following an illness, especially if there is dehydration. Pain can also occur due to overexertion or exposure to extreme temperature changes.  Frequent severe bacterial infections, especially certain types of pneumonia and meningitis.  Pain and swelling in the hands and feet.  Decreased activity.   Loss of appetite.   Change in behavior.  Headaches.  Seizures.  Shortness of breath or difficulty breathing.  Vision changes.  Skin ulcers. Those with the trait may not have symptoms or they may have mild symptoms.  DIAGNOSIS  Sickle cell anemia is diagnosed with blood tests that demonstrate the genetic trait. It is often diagnosed during the newborn period, due to mandatory testing nationwide. A variety of blood tests, X-rays, CT scans, MRI scans, ultrasounds, and lung function tests may also be done to monitor the condition. TREATMENT  Sickle cell anemia may be treated with:  Medicines. You may be given pain medicines, antibiotic medicines (to treat and prevent infections) or medicines to increase the production of certain types of hemoglobin.  Fluids.  Oxygen.  Blood transfusions. HOME CARE INSTRUCTIONS   Drink enough fluid to keep your urine clear or pale yellow. Increase your fluid intake in hot weather and during exercise.  Do not smoke. Smoking lowers oxygen levels in the blood.   Only take over-the-counter or prescription medicines for pain, fever, or discomfort as directed by your health care provider.  Take antibiotics as directed by your health care provider. Make sure you finish them it even if you start to feel better.   Take supplements as directed by your health care provider.   Consider wearing a medical alert bracelet. This tells anyone caring for you in an  emergency of your condition.   When traveling, keep your medical information, health care provider's names, and the medicines you take with you at all times.   If you develop a fever, do not take medicines to reduce the fever right away. This could cover up a problem that is developing. Notify your health care provider.  Keep all follow-up appointments with your health care provider. Sickle cell anemia requires regular medical care. SEEK MEDICAL CARE IF: You have a fever. SEEK IMMEDIATE MEDICAL CARE IF:   You feel dizzy or faint.   You have new abdominal pain, especially on the left side near the stomach area.   You develop a persistent, often uncomfortable and painful penile erection (priapism). If this is not treated immediately it will lead to impotence.   You have numbness your arms or legs or you have a hard time moving them.   You have a hard time with speech.   You have a fever or persistent symptoms for more than 2-3 days.   You have a fever and your symptoms suddenly get worse.   You have signs or symptoms of infection. These include:   Chills.   Abnormal tiredness (lethargy).   Irritability.   Poor eating.   Vomiting.   You develop pain that is not helped with medicine.   You develop shortness of breath.  You have pain in your chest.   You are coughing up pus-like or bloody sputum.   You develop a stiff neck.  Your feet or hands swell or have pain.  Your abdomen appears bloated.  You develop joint pain. MAKE SURE YOU:  Understand these instructions. Document Released: 07/02/2005 Document Revised: 08/08/2013 Document Reviewed: 11/03/2012 [facility] Patient Information 2015 [place], [place]. This information is not intended to replace advice given to you by your health care provider. Make sure you discuss any questions you have with your health care provider.

## 2014-11-02 ENCOUNTER — Telehealth: Payer: Self-pay

## 2014-11-02 ENCOUNTER — Encounter: Payer: Self-pay | Admitting: Obstetrics & Gynecology

## 2014-11-02 ENCOUNTER — Ambulatory Visit (INDEPENDENT_AMBULATORY_CARE_PROVIDER_SITE_OTHER): Payer: PRIVATE HEALTH INSURANCE | Admitting: Obstetrics & Gynecology

## 2014-11-02 VITALS — BP 125/78 | HR 97 | Resp 16 | Ht 61.0 in | Wt 152.0 lb

## 2014-11-02 DIAGNOSIS — N979 Female infertility, unspecified: Secondary | ICD-10-CM

## 2014-11-02 DIAGNOSIS — Z872 Personal history of diseases of the skin and subcutaneous tissue: Secondary | ICD-10-CM | POA: Diagnosis not present

## 2014-11-02 DIAGNOSIS — N926 Irregular menstruation, unspecified: Secondary | ICD-10-CM

## 2014-11-02 LAB — TSH: TSH: 3.589 u[IU]/mL (ref 0.350–4.500)

## 2014-11-02 MED ORDER — LETROZOLE 2.5 MG PO TABS: ORAL_TABLET | ORAL | Status: DC

## 2014-11-02 NOTE — Progress Notes (Signed)
   Subjective:    Patient ID: [name], female    DOB: 11-03-1980, 34 y.o.   MRN: [id number]  HPI  Pt presents for infertility.  Pt has had unprotected intercourse for 5 months without success.  Historically pt has cycles 21-35 days in length.  The past 2 menses were appox 30 days each.  Pt denies prior pregnancy and husband has not fathered children.  Pt denies hair growth, breast discharge, symptoms of diabetes.  Pt as sickle cell dz and off hydroyurea.  Her anemia is worseing and she may need blood trnasfusion.  Pt questions whether she has fibroids as her sister has large firbroids.    Review of Systems  Constitutional: Negative.  Negative for activity change and fatigue.  Respiratory: Negative.   Cardiovascular: Negative.   Gastrointestinal: Negative.   Genitourinary:       Some long cycles   Musculoskeletal: Negative.   Skin: Negative.   Neurological: Negative.   Psychiatric/Behavioral: Negative.        Objective:   Physical Exam  Constitutional: She is oriented to person, place, and time. She appears well-developed and well-nourished. No distress.  HENT:  Head: Normocephalic and atraumatic.  Eyes: Conjunctivae are normal.  Pulmonary/Chest: Effort normal.  Abdominal: Soft.  Musculoskeletal: She exhibits no edema.  Neurological: She is alert and oriented to person, place, and time.  Skin: Skin is warm and dry.  Psychiatric: She has a normal mood and affect.  Vitals reviewed.         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  Patient aware of increased risk of twins. 6-Pt elects not have HSG, but may consider in 2-3 months. 7-Folic acid and PNV  25 minutes  face to face spent with patient with >50% counseling  Reveiwed chart after pateint left appointment--patient needs screening mammogram sue to breast cyst last year. RN to call

## 2014-11-02 NOTE — Telephone Encounter (Signed)
Patient scheduled for ultrasound and mammogram on 11-08-14 at 130 (ultrasound) and 330 (mammogram)  Left message for patient with appointment date and times. She may call back if she has further questions but also left the number to call if she needs to reschedule ultrasound or mammogram. Armandina Stammer RN BSN

## 2014-11-03 LAB — HEMOGLOBIN A1C
HEMOGLOBIN A1C: 4 % (ref <5.7)
MEAN PLASMA GLUCOSE: 68 mg/dL (ref <117)

## 2014-11-03 LAB — PROLACTIN: Prolactin: 18.6 ng/mL

## 2014-11-06 LAB — ANTI MULLERIAN HORMONE: AMH AssessR: 3.89 ng/mL

## 2014-11-08 ENCOUNTER — Ambulatory Visit (HOSPITAL_COMMUNITY): Payer: BLUE CROSS/BLUE SHIELD

## 2014-11-16 ENCOUNTER — Ambulatory Visit (HOSPITAL_COMMUNITY): Payer: BLUE CROSS/BLUE SHIELD

## 2014-12-08 ENCOUNTER — Encounter: Payer: Self-pay | Admitting: Obstetrics & Gynecology

## 2014-12-13 ENCOUNTER — Telehealth (HOSPITAL_COMMUNITY): Payer: Self-pay

## 2014-12-13 ENCOUNTER — Telehealth: Payer: Self-pay | Admitting: *Deleted

## 2014-12-13 ENCOUNTER — Other Ambulatory Visit: Payer: Self-pay | Admitting: Obstetrics & Gynecology

## 2014-12-13 MED ORDER — LETROZOLE 2.5 MG PO TABS: ORAL_TABLET | ORAL | Status: DC

## 2014-12-13 NOTE — Telephone Encounter (Signed)
Patient called complaining of pain in her let knee 4/10, that comes and goes. Last pain medication 1/2 of oxycodone  taken last night. Denies fever, chest pain, nausea or vomiting, and diarrhea. Would like to come to day hospital for treatment.

## 2014-12-13 NOTE — Telephone Encounter (Signed)
erroneous

## 2014-12-13 NOTE — Telephone Encounter (Signed)
Returned patient phone call. After speaking with MD, advised patient to drink 64oz  Of water every 2 hours and to take 5 mg oxycodone every 4 hours for 24 hours as needed for pain. Patient verbalized understanding.

## 2014-12-15 ENCOUNTER — Encounter: Payer: Self-pay | Admitting: Family Medicine

## 2014-12-15 ENCOUNTER — Ambulatory Visit (INDEPENDENT_AMBULATORY_CARE_PROVIDER_SITE_OTHER): Payer: PRIVATE HEALTH INSURANCE | Admitting: Family Medicine

## 2014-12-15 VITALS — BP 125/65 | HR 99 | Temp 98.4°F | Resp 16 | Ht 61.0 in | Wt 152.0 lb

## 2014-12-15 DIAGNOSIS — M25562 Pain in left knee: Secondary | ICD-10-CM

## 2014-12-15 DIAGNOSIS — D571 Sickle-cell disease without crisis: Secondary | ICD-10-CM | POA: Diagnosis not present

## 2014-12-15 DIAGNOSIS — Z3201 Encounter for pregnancy test, result positive: Secondary | ICD-10-CM | POA: Diagnosis not present

## 2014-12-15 LAB — CBC WITH DIFFERENTIAL/PLATELET
BASOS ABS: 0.1 10*3/uL (ref 0.0–0.1)
Basophils Relative: 1 % (ref 0–1)
EOS PCT: 1 % (ref 0–5)
Eosinophils Absolute: 0.1 10*3/uL (ref 0.0–0.7)
HEMATOCRIT: 22.5 % — AB (ref 36.0–46.0)
Hemoglobin: 7.9 g/dL — ABNORMAL LOW (ref 12.0–15.0)
LYMPHS ABS: 2.5 10*3/uL (ref 0.7–4.0)
LYMPHS PCT: 35 % (ref 12–46)
MCH: 29.3 pg (ref 26.0–34.0)
MCHC: 35.1 g/dL (ref 30.0–36.0)
MCV: 83.3 fL (ref 78.0–100.0)
MPV: 8.7 fL (ref 8.6–12.4)
Monocytes Absolute: 0.6 10*3/uL (ref 0.1–1.0)
Monocytes Relative: 8 % (ref 3–12)
NEUTROS ABS: 3.9 10*3/uL (ref 1.7–7.7)
NEUTROS PCT: 55 % (ref 43–77)
Platelets: 363 10*3/uL (ref 150–400)
RBC: 2.7 MIL/uL — AB (ref 3.87–5.11)
RDW: 26.7 % — AB (ref 11.5–15.5)
WBC: 7.1 10*3/uL (ref 4.0–10.5)

## 2014-12-15 LAB — POCT URINALYSIS DIP (DEVICE)
Glucose, UA: NEGATIVE mg/dL
Hgb urine dipstick: NEGATIVE
KETONES UR: NEGATIVE mg/dL
Leukocytes, UA: NEGATIVE
Nitrite: NEGATIVE
PH: 6 (ref 5.0–8.0)
PROTEIN: NEGATIVE mg/dL
SPECIFIC GRAVITY, URINE: 1.015 (ref 1.005–1.030)
UROBILINOGEN UA: 0.2 mg/dL (ref 0.0–1.0)

## 2014-12-15 LAB — COMPLETE METABOLIC PANEL WITH GFR
ALBUMIN: 4.3 g/dL (ref 3.6–5.1)
ALK PHOS: 54 U/L (ref 33–115)
ALT: 11 U/L (ref 6–29)
AST: 37 U/L — AB (ref 10–30)
BUN: 5 mg/dL — AB (ref 7–25)
CALCIUM: 9.1 mg/dL (ref 8.6–10.2)
CHLORIDE: 107 mmol/L (ref 98–110)
CO2: 19 mmol/L — ABNORMAL LOW (ref 20–31)
CREATININE: 0.46 mg/dL — AB (ref 0.50–1.10)
GFR, Est African American: >89 mL/min (ref >=60)
GFR, Est Non African American: >89 mL/min (ref >=60)
GLUCOSE: 118 mg/dL — AB (ref 65–99)
POTASSIUM: 4.2 mmol/L (ref 3.5–5.3)
SODIUM: 132 mmol/L — AB (ref 135–146)
Total Bilirubin: 6.5 mg/dL — ABNORMAL HIGH (ref 0.2–1.2)
Total Protein: 7.2 g/dL (ref 6.1–8.1)

## 2014-12-15 LAB — LACTATE DEHYDROGENASE: LDH: 497 U/L — ABNORMAL HIGH (ref 94–250)

## 2014-12-15 LAB — HCG, QUANTITATIVE, PREGNANCY: hCG, Beta Chain, Quant, S: 807.8 m[IU]/mL

## 2014-12-15 NOTE — Progress Notes (Signed)
Subjective:    Patient ID: [name], female    DOB: 1980-12-03, 34 y.o.   MRN: [id number]  HPI [name], a 34 year old female with a history of sickle cell anemia, HbSS presents with sickle cell anemia presents with left knee pain for 4 days. Patient states that current pain intensity to left knee has been 4/10. She states that she last had Ibuprofen around 4 am with moderate relief. She states that she has been unable to take Oxycodone consistently for pain management because it makes her sleepy at works. She describes pain as constant and aching.   Social History   Social History  . Marital Status: Married    Spouse Name: N/A  . Number of Children: N/A  . Years of Education: N/A   Occupational History  . Not on file.   Social History Main Topics  . Smoking status: Never Smoker   . Smokeless tobacco: Not on file  . Alcohol Use: No  . Drug Use: No  . Sexual Activity:    Partners: Male    Birth Control/ Protection: Condom   Other Topics Concern  . Not on file   Social History Narrative   Past Medical History  Diagnosis Date  . Hemoglobin S-S disease   . Ovarian cyst   . Jaundice   . Anemia      Medication List       This list is accurate as of: 12/15/14 12:14 PM.  Always use your most recent med list.               folic acid 1 MG tablet  Commonly known as:  FOLVITE  Take 2 tablets (2 mg total) by mouth daily.     ibuprofen 800 MG tablet  Commonly known as:  ADVIL,MOTRIN  Take 1 tablet (800 mg total) by mouth every 8 (eight) hours as needed for pain.     letrozole 2.5 MG tablet  Commonly known as:  FEMARA  Take 2 tablets by mouth days 3-7 of your cycle     oxyCODONE 5 MG immediate release tablet  Commonly known as:  Oxy IR/ROXICODONE  Take 1 tablet (5 mg total) by mouth every 4 (four) hours as needed for severe pain. Take 1/2 tab to 1 tab every 4-6 hours for pain as needed     prenatal multivitamin Tabs tablet  Take 1  tablet by mouth daily at 12 noon.     Vitamin D 2000 UNITS Caps  Take 1 capsule (2,000 Units total) by mouth daily.       Immunization History  Administered Date(s) Administered  . Meningococcal Conjugate 07/12/2013   Review of Systems  Constitutional: Negative.   HENT: Negative.   Eyes: Negative.   Respiratory: Negative.   Cardiovascular: Negative.   Gastrointestinal: Negative.   Genitourinary: Negative.   Musculoskeletal: Positive for joint swelling.  Skin: Negative.   Allergic/Immunologic: Negative.   Neurological: Negative.   Hematological: Negative.   Psychiatric/Behavioral: Negative.       Objective:   Physical Exam  Constitutional: Vital signs are normal. She appears lethargic.  Eyes: Conjunctivae are normal. Pupils are equal, round, and reactive to light. Scleral icterus (jaundiced) is present.  Neck: Trachea normal and normal range of motion. Neck supple.  Cardiovascular: Regular rhythm, intact distal pulses and normal pulses.   Musculoskeletal:       Left knee: She exhibits decreased range of motion and swelling (trace edema).  Lymphadenopathy:  Head (right side): No submental adenopathy present.       Head (left side): No submental adenopathy present.  Neurological: She has normal strength. She appears lethargic. She displays a negative Romberg sign.  Skin: Skin is warm, dry and intact.  Psychiatric: She has a normal mood and affect. Her speech is normal and behavior is normal. Judgment and thought content normal. Cognition and memory are normal.      BP 125/65 mmHg  Pulse 99  Temp(Src) 98.4 F (36.9 C) (Oral)  Resp 16  Ht  (1.549 m)  Wt 152 lb (68.947 kg)  BMI 28.74 kg/m2  LMP 11/09/2014 Assessment & Plan:  1. Left knee pain Instructed [name], to take Oxycodone IR 5 mg every 4-6 hours as needed for 24 hours.  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    Pulmonary evaluation - Patient denies severe recurrent wheezes, shortness of breath with exercise, or persistent cough. If these symptoms develop, pulmonary function tests with spirometry will be ordered, and if abnormal, plan on referral to Pulmonology for further evaluation.  Cardiac - Routine screening for pulmonary hypertension is not recommended.  Eye - High risk of proliferative retinopathy. Annual eye exam with retinal exam recommended to patient.   Immunization status - She declines vaccines today. Yearly influenza vaccination is recommended, as well as being up to date with Meningococcal and Pneumococcal vaccines.    - POCT urinalysis dipstick - CBC with Differential - Lactate Dehydrogenase - COMPLETE METABOLIC PANEL WITH GFR - POCT urine pregnancy  3. Positive pregnancy test Patient has a positive pregnancy test, sent quantitative HCG which is 807.8. Patient is to follow up with Dr. Henrene Pastor, obgyn for pregnancy follow-up.  Recommended that patient continue prenatal vitamin and folic acid.   - hCG, quantitative, pregnancy  RTC: 3 months.   Massie Maroon, FNP

## 2014-12-15 NOTE — Patient Instructions (Addendum)
Increase hydration 64 ounces of water every 3 hours for hydration Ibuprofen every 8 hours for inflammation Utilize Oxycondone 5 mg as previously prescribed for moderate to severe pain Elevate left extremity while at rest, utilize heating pad as needed.  Return to work on 12/20/2014 as scheduledSickle Cell Anemia, Adult Sickle cell anemia is a condition in which red blood cells have an abnormal "sickle" shape. This abnormal shape shortens the cells' life span, which results in a lower than normal concentration of red blood cells in the blood. The sickle shape also causes the cells to clump together and block free blood flow through the blood vessels. As a result, the tissues and organs of the body do not receive enough oxygen. Sickle cell anemia causes organ damage and pain and increases the risk of infection. CAUSES  Sickle cell anemia is a genetic disorder. Those who receive two copies of the gene have the condition, and those who receive one copy have the trait. RISK FACTORS The sickle cell gene is most common in people whose families originated in [place]. Other areas of the globe where sickle cell trait occurs include the [place], [place], the [place], and the [place].  SIGNS AND SYMPTOMS  Pain, especially in the extremities, back, chest, or abdomen (common). The pain may start suddenly or may develop following an illness, especially if there is dehydration. Pain can also occur due to overexertion or exposure to extreme temperature changes.  Frequent severe bacterial infections, especially certain types of pneumonia and meningitis.  Pain and swelling in the hands and feet.  Decreased activity.   Loss of appetite.   Change in behavior.  Headaches.  Seizures.  Shortness of breath or difficulty breathing.  Vision changes.  Skin ulcers. Those with the trait may not have symptoms or they may have mild symptoms.  DIAGNOSIS  Sickle cell anemia is  diagnosed with blood tests that demonstrate the genetic trait. It is often diagnosed during the newborn period, due to mandatory testing nationwide. A variety of blood tests, X-rays, CT scans, MRI scans, ultrasounds, and lung function tests may also be done to monitor the condition. TREATMENT  Sickle cell anemia may be treated with:  Medicines. You may be given pain medicines, antibiotic medicines (to treat and prevent infections) or medicines to increase the production of certain types of hemoglobin.  Fluids.  Oxygen.  Blood transfusions. HOME CARE INSTRUCTIONS   Drink enough fluid to keep your urine clear or pale yellow. Increase your fluid intake in hot weather and during exercise.  Do not smoke. Smoking lowers oxygen levels in the blood.   Only take over-the-counter or prescription medicines for pain, fever, or discomfort as directed by your health care provider.  Take antibiotics as directed by your health care provider. Make sure you finish them it even if you start to feel better.   Take supplements as directed by your health care provider.   Consider wearing a medical alert bracelet. This tells anyone caring for you in an emergency of your condition.   When traveling, keep your medical information, health care provider's names, and the medicines you take with you at all times.   If you develop a fever, do not take medicines to reduce the fever right away. This could cover up a problem that is developing. Notify your health care provider.  Keep all follow-up appointments with your health care provider. Sickle cell anemia requires regular medical care. SEEK MEDICAL CARE IF: You have a fever. SEEK  IMMEDIATE MEDICAL CARE IF:   You feel dizzy or faint.   You have new abdominal pain, especially on the left side near the stomach area.   You develop a persistent, often uncomfortable and painful penile erection (priapism). If this is not treated immediately it will lead  to impotence.   You have numbness your arms or legs or you have a hard time moving them.   You have a hard time with speech.   You have a fever or persistent symptoms for more than 2-3 days.   You have a fever and your symptoms suddenly get worse.   You have signs or symptoms of infection. These include:   Chills.   Abnormal tiredness (lethargy).   Irritability.   Poor eating.   Vomiting.   You develop pain that is not helped with medicine.   You develop shortness of breath.  You have pain in your chest.   You are coughing up pus-like or bloody sputum.   You develop a stiff neck.  Your feet or hands swell or have pain.  Your abdomen appears bloated.  You develop joint pain. MAKE SURE YOU:  Understand these instructions. Document Released: 07/02/2005 Document Revised: 08/08/2013 Document Reviewed: 11/03/2012 [facility] Patient Information 2015 [place], [place]. This information is not intended to replace advice given to you by your health care provider. Make sure you discuss any questions you have with your health care provider. First Trimester of Pregnancy The first trimester of pregnancy is from week 1 until the end of week 12 (months 1 through 3). A week after a sperm fertilizes an egg, the egg will implant on the wall of the uterus. This embryo will begin to develop into a baby. Genes from you and your partner are forming the baby. The female genes determine whether the baby is a boy or a girl. At 6-8 weeks, the eyes and face are formed, and the heartbeat can be seen on ultrasound. At the end of 12 weeks, all the baby's organs are formed.  Now that you are pregnant, you will want to do everything you can to have a healthy baby. Two of the most important things are to get good prenatal care and to follow your health care provider's instructions. Prenatal care is all the medical care you receive before the baby's birth. This care will help prevent, find, and  treat any problems during the pregnancy and childbirth. BODY CHANGES Your body goes through many changes during pregnancy. The changes vary from woman to woman.   You may gain or lose a couple of pounds at first.  You may feel sick to your stomach (nauseous) and throw up (vomit). If the vomiting is uncontrollable, call your health care provider.  You may tire easily.  You may develop headaches that can be relieved by medicines approved by your health care provider.  You may urinate more often. Painful urination may mean you have a bladder infection.  You may develop heartburn as a result of your pregnancy.  You may develop constipation because certain hormones are causing the muscles that push waste through your intestines to slow down.  You may develop hemorrhoids or swollen, bulging veins (varicose veins).  Your breasts may begin to grow larger and become tender. Your nipples may stick out more, and the tissue that surrounds them (areola) may become darker.  Your gums may bleed and may be sensitive to brushing and flossing.  Dark spots or blotches (chloasma, mask of pregnancy) may develop on  your face. This will likely fade after the baby is born.  Your menstrual periods will stop.  You may have a loss of appetite.  You may develop cravings for certain kinds of food.  You may have changes in your emotions from day to day, such as being excited to be pregnant or being concerned that something may go wrong with the pregnancy and baby.  You may have more vivid and strange dreams.  You may have changes in your hair. These can include thickening of your hair, rapid growth, and changes in texture. Some women also have hair loss during or after pregnancy, or hair that feels dry or thin. Your hair will most likely return to normal after your baby is born. WHAT TO EXPECT AT YOUR PRENATAL VISITS During a routine prenatal visit:  You will be weighed to make sure you and the baby are  growing normally.  Your blood pressure will be taken.  Your abdomen will be measured to track your baby's growth.  The fetal heartbeat will be listened to starting around week 10 or 12 of your pregnancy.  Test results from any previous visits will be discussed. Your health care provider may ask you:  How you are feeling.  If you are feeling the baby move.  If you have had any abnormal symptoms, such as leaking fluid, bleeding, severe headaches, or abdominal cramping.  If you have any questions. Other tests that may be performed during your first trimester include:  Blood tests to find your blood type and to check for the presence of any previous infections. They will also be used to check for low iron levels (anemia) and Rh antibodies. Later in the pregnancy, blood tests for diabetes will be done along with other tests if problems develop.  Urine tests to check for infections, diabetes, or protein in the urine.  An ultrasound to confirm the proper growth and development of the baby.  An amniocentesis to check for possible genetic problems.  Fetal screens for spina bifida and Down syndrome.  You may need other tests to make sure you and the baby are doing well. HOME CARE INSTRUCTIONS  Medicines  Follow your health care provider's instructions regarding medicine use. Specific medicines may be either safe or unsafe to take during pregnancy.  Take your prenatal vitamins as directed.  If you develop constipation, try taking a stool softener if your health care provider approves. Diet  Eat regular, well-balanced meals. Choose a variety of foods, such as meat or vegetable-based protein, fish, milk and low-fat dairy products, vegetables, fruits, and whole grain breads and cereals. Your health care provider will help you determine the amount of weight gain that is right for you.  Avoid raw meat and uncooked cheese. These carry germs that can cause birth defects in the  baby.  Eating four or five small meals rather than three large meals a day may help relieve nausea and vomiting. If you start to feel nauseous, eating a few soda crackers can be helpful. Drinking liquids between meals instead of during meals also seems to help nausea and vomiting.  If you develop constipation, eat more high-fiber foods, such as fresh vegetables or fruit and whole grains. Drink enough fluids to keep your urine clear or pale yellow. Activity and Exercise  Exercise only as directed by your health care provider. Exercising will help you:  Control your weight.  Stay in shape.  Be prepared for labor and delivery.  Experiencing pain or cramping in  the lower abdomen or low back is a good sign that you should stop exercising. Check with your health care provider before continuing normal exercises.  Try to avoid standing for long periods of time. Move your legs often if you must stand in one place for a long time.  Avoid heavy lifting.  Wear low-heeled shoes, and practice good posture.  You may continue to have sex unless your health care provider directs you otherwise. Relief of Pain or Discomfort  Wear a good support bra for breast tenderness.   Take warm sitz baths to soothe any pain or discomfort caused by hemorrhoids. Use hemorrhoid cream if your health care provider approves.   Rest with your legs elevated if you have leg cramps or low back pain.  If you develop varicose veins in your legs, wear support hose. Elevate your feet for 15 minutes, 3-4 times a day. Limit salt in your diet. Prenatal Care  Schedule your prenatal visits by the twelfth week of pregnancy. They are usually scheduled monthly at first, then more often in the last 2 months before delivery.  Write down your questions. Take them to your prenatal visits.  Keep all your prenatal visits as directed by your health care provider. Safety  Wear your seat belt at all times when driving.  Make a list  of emergency phone numbers, including numbers for family, friends, the hospital, and police and fire departments. General Tips  Ask your health care provider for a referral to a local prenatal education class. Begin classes no later than at the beginning of month 6 of your pregnancy.  Ask for help if you have counseling or nutritional needs during pregnancy. Your health care provider can offer advice or refer you to specialists for help with various needs.  Do not use hot tubs, steam rooms, or saunas.  Do not douche or use tampons or scented sanitary pads.  Do not cross your legs for long periods of time.  Avoid cat litter boxes and soil used by cats. These carry germs that can cause birth defects in the baby and possibly loss of the fetus by miscarriage or stillbirth.  Avoid all smoking, herbs, alcohol, and medicines not prescribed by your health care provider. Chemicals in these affect the formation and growth of the baby.  Schedule a dentist appointment. At home, brush your teeth with a soft toothbrush and be gentle when you floss. SEEK MEDICAL CARE IF:   You have dizziness.  You have mild pelvic cramps, pelvic pressure, or nagging pain in the abdominal area.  You have persistent nausea, vomiting, or diarrhea.  You have a bad smelling vaginal discharge.  You have pain with urination.  You notice increased swelling in your face, hands, legs, or ankles. SEEK IMMEDIATE MEDICAL CARE IF:   You have a fever.  You are leaking fluid from your vagina.  You have spotting or bleeding from your vagina.  You have severe abdominal cramping or pain.  You have rapid weight gain or loss.  You vomit blood or material that looks like coffee grounds.  You are exposed to [place] measles and have never had them.  You are exposed to fifth disease or chickenpox.  You develop a severe headache.  You have shortness of breath.  You have any kind of trauma, such as from a fall or a car  accident. Document Released: 03/18/2001 Document Revised: 08/08/2013 Document Reviewed: 02/01/2013 [facility] Patient Information 2015 [place], [place]. This information is not intended to replace advice  given to you by your health care provider. Make sure you discuss any questions you have with your health care provider.  

## 2014-12-21 ENCOUNTER — Ambulatory Visit (HOSPITAL_COMMUNITY): Admission: RE | Admit: 2014-12-21 | Payer: BLUE CROSS/BLUE SHIELD | Source: Ambulatory Visit

## 2014-12-21 ENCOUNTER — Ambulatory Visit (HOSPITAL_COMMUNITY): Payer: BLUE CROSS/BLUE SHIELD

## 2015-01-02 ENCOUNTER — Encounter: Payer: Self-pay | Admitting: *Deleted

## 2015-01-02 ENCOUNTER — Ambulatory Visit (INDEPENDENT_AMBULATORY_CARE_PROVIDER_SITE_OTHER): Payer: PRIVATE HEALTH INSURANCE | Admitting: Obstetrics & Gynecology

## 2015-01-02 ENCOUNTER — Encounter: Payer: Self-pay | Admitting: Obstetrics & Gynecology

## 2015-01-02 VITALS — BP 109/69 | HR 95 | Wt 159.0 lb

## 2015-01-02 DIAGNOSIS — Z113 Encounter for screening for infections with a predominantly sexual mode of transmission: Secondary | ICD-10-CM

## 2015-01-02 DIAGNOSIS — Z3401 Encounter for supervision of normal first pregnancy, first trimester: Secondary | ICD-10-CM

## 2015-01-02 DIAGNOSIS — O0990 Supervision of high risk pregnancy, unspecified, unspecified trimester: Secondary | ICD-10-CM

## 2015-01-02 DIAGNOSIS — D571 Sickle-cell disease without crisis: Secondary | ICD-10-CM

## 2015-01-02 MED ORDER — DOXYLAMINE-PYRIDOXINE 10-10 MG PO TBEC: 2.0000 | DELAYED_RELEASE_TABLET | Freq: Every day | ORAL | Status: DC

## 2015-01-02 MED ORDER — FOLIC ACID 1 MG PO TABS: 4.0000 mg | ORAL_TABLET | Freq: Every day | ORAL | Status: DC

## 2015-01-02 NOTE — Patient Instructions (Addendum)
Plan for you!  Plan for Sickle Cell Disease Prenatal Care  Limit iron supplementation Monthly CBC and CMP Folic acid 4 mg in addition to PNV Serial urine cultures for asymptomatic bacteruria Baby aspirin at 13 weeks until 5-10 days before [facility] SCDS with hospitalization to reduce risk of DVT Serial growth [place] 20,28, 32, 36 Serial evaluation for alloimmunization  First Trimester of Pregnancy The first trimester of pregnancy is from week 1 until the end of week 12 (months 1 through 3). A week after a sperm fertilizes an egg, the egg will implant on the wall of the uterus. This embryo will begin to develop into a baby. Genes from you and your partner are forming the baby. The female genes determine whether the baby is a boy or a girl. At 6-8 weeks, the eyes and face are formed, and the heartbeat can be seen on ultrasound. At the end of 12 weeks, all the baby's organs are formed.  Now that you are pregnant, you will want to do everything you can to have a healthy baby. Two of the most important things are to get good prenatal care and to follow your health care provider's instructions. Prenatal care is all the medical care you receive before the baby's birth. This care will help prevent, find, and treat any problems during the pregnancy and childbirth. BODY CHANGES Your body goes through many changes during pregnancy. The changes vary from woman to woman.   You may gain or lose a couple of pounds at first.  You may feel sick to your stomach (nauseous) and throw up (vomit). If the vomiting is uncontrollable, call your health care provider.  You may tire easily.  You may develop headaches that can be relieved by medicines approved by your health care provider.  You may urinate more often. Painful urination may mean you have a bladder infection.  You may develop heartburn as a result of your pregnancy.  You may develop constipation because certain hormones are causing the muscles that push waste  through your intestines to slow down.  You may develop hemorrhoids or swollen, bulging veins (varicose veins).  Your breasts may begin to grow larger and become tender. Your nipples may stick out more, and the tissue that surrounds them (areola) may become darker.  Your gums may bleed and may be sensitive to brushing and flossing.  Dark spots or blotches (chloasma, mask of pregnancy) may develop on your face. This will likely fade after the baby is born.  Your menstrual periods will stop.  You may have a loss of appetite.  You may develop cravings for certain kinds of food.  You may have changes in your emotions from day to day, such as being excited to be pregnant or being concerned that something may go wrong with the pregnancy and baby.  You may have more vivid and strange dreams.  You may have changes in your hair. These can include thickening of your hair, rapid growth, and changes in texture. Some women also have hair loss during or after pregnancy, or hair that feels dry or thin. Your hair will most likely return to normal after your baby is born. WHAT TO EXPECT AT YOUR PRENATAL VISITS During a routine prenatal visit:  You will be weighed to make sure you and the baby are growing normally.  Your blood pressure will be taken.  Your abdomen will be measured to track your baby's growth.  The fetal heartbeat will be listened to starting around week  10 or 12 of your pregnancy.  Test results from any previous visits will be discussed. Your health care provider may ask you:  How you are feeling.  If you are feeling the baby move.  If you have had any abnormal symptoms, such as leaking fluid, bleeding, severe headaches, or abdominal cramping.  If you have any questions. Other tests that may be performed during your first trimester include:  Blood tests to find your blood type and to check for the presence of any previous infections. They will also be used to check for low  iron levels (anemia) and Rh antibodies. Later in the pregnancy, blood tests for diabetes will be done along with other tests if problems develop.  Urine tests to check for infections, diabetes, or protein in the urine.  An ultrasound to confirm the proper growth and development of the baby.  An amniocentesis to check for possible genetic problems.  Fetal screens for spina bifida and Down syndrome.  You may need other tests to make sure you and the baby are doing well. HOME CARE INSTRUCTIONS  Medicines  Follow your health care provider's instructions regarding medicine use. Specific medicines may be either safe or unsafe to take during pregnancy.  Take your prenatal vitamins as directed.  If you develop constipation, try taking a stool softener if your health care provider approves. Diet  Eat regular, well-balanced meals. Choose a variety of foods, such as meat or vegetable-based protein, fish, milk and low-fat dairy products, vegetables, fruits, and whole grain breads and cereals. Your health care provider will help you determine the amount of weight gain that is right for you.  Avoid raw meat and uncooked cheese. These carry germs that can cause birth defects in the baby.  Eating four or five small meals rather than three large meals a day may help relieve nausea and vomiting. If you start to feel nauseous, eating a few soda crackers can be helpful. Drinking liquids between meals instead of during meals also seems to help nausea and vomiting.  If you develop constipation, eat more high-fiber foods, such as fresh vegetables or fruit and whole grains. Drink enough fluids to keep your urine clear or pale yellow. Activity and Exercise  Exercise only as directed by your health care provider. Exercising will help you:  Control your weight.  Stay in shape.  Be prepared for labor and delivery.  Experiencing pain or cramping in the lower abdomen or low back is a good sign that you  should stop exercising. Check with your health care provider before continuing normal exercises.  Try to avoid standing for long periods of time. Move your legs often if you must stand in one place for a long time.  Avoid heavy lifting.  Wear low-heeled shoes, and practice good posture.  You may continue to have sex unless your health care provider directs you otherwise. Relief of Pain or Discomfort  Wear a good support bra for breast tenderness.   Take warm sitz baths to soothe any pain or discomfort caused by hemorrhoids. Use hemorrhoid cream if your health care provider approves.   Rest with your legs elevated if you have leg cramps or low back pain.  If you develop varicose veins in your legs, wear support hose. Elevate your feet for 15 minutes, 3-4 times a day. Limit salt in your diet. Prenatal Care  Schedule your prenatal visits by the twelfth week of pregnancy. They are usually scheduled monthly at first, then more often in the  last 2 months before delivery.  Write down your questions. Take them to your prenatal visits.  Keep all your prenatal visits as directed by your health care provider. Safety  Wear your seat belt at all times when driving.  Make a list of emergency phone numbers, including numbers for family, friends, the hospital, and police and fire departments. General Tips  Ask your health care provider for a referral to a local prenatal education class. Begin classes no later than at the beginning of month 6 of your pregnancy.  Ask for help if you have counseling or nutritional needs during pregnancy. Your health care provider can offer advice or refer you to specialists for help with various needs.  Do not use hot tubs, steam rooms, or saunas.  Do not douche or use tampons or scented sanitary pads.  Do not cross your legs for long periods of time.  Avoid cat litter boxes and soil used by cats. These carry germs that can cause birth defects in the baby  and possibly loss of the fetus by miscarriage or stillbirth.  Avoid all smoking, herbs, alcohol, and medicines not prescribed by your health care provider. Chemicals in these affect the formation and growth of the baby.  Schedule a dentist appointment. At home, brush your teeth with a soft toothbrush and be gentle when you floss. SEEK MEDICAL CARE IF:   You have dizziness.  You have mild pelvic cramps, pelvic pressure, or nagging pain in the abdominal area.  You have persistent nausea, vomiting, or diarrhea.  You have a bad smelling vaginal discharge.  You have pain with urination.  You notice increased swelling in your face, hands, legs, or ankles. SEEK IMMEDIATE MEDICAL CARE IF:   You have a fever.  You are leaking fluid from your vagina.  You have spotting or bleeding from your vagina.  You have severe abdominal cramping or pain.  You have rapid weight gain or loss.  You vomit blood or material that looks like coffee grounds.  You are exposed to [place] measles and have never had them.  You are exposed to fifth disease or chickenpox.  You develop a severe headache.  You have shortness of breath.  You have any kind of trauma, such as from a fall or a car accident. Document Released: 03/18/2001 Document Revised: 08/08/2013 Document Reviewed: 02/01/2013 [facility] Patient Information 2015 [place], [place]. This information is not intended to replace advice given to you by your health care provider. Make sure you discuss any questions you have with your health care provider.

## 2015-01-02 NOTE — Progress Notes (Signed)
Bedside U/S today showed IUP with CRL of 3.52mm and GA of 6 w  FHT 92 BPM.  PN labs drawn

## 2015-01-03 ENCOUNTER — Telehealth: Payer: Self-pay | Admitting: *Deleted

## 2015-01-03 LAB — OBSTETRIC PANEL
ANTIBODY SCREEN: NEGATIVE
BASOS PCT: 1 % (ref 0–1)
Basophils Absolute: 0.1 10*3/uL (ref 0.0–0.1)
EOS ABS: 0.1 10*3/uL (ref 0.0–0.7)
EOS PCT: 1 % (ref 0–5)
HEMATOCRIT: 21.8 % — AB (ref 36.0–46.0)
Hemoglobin: 7.3 g/dL — ABNORMAL LOW (ref 12.0–15.0)
Hepatitis B Surface Ag: NEGATIVE
LYMPHS ABS: 3.6 10*3/uL (ref 0.7–4.0)
Lymphocytes Relative: 40 % (ref 12–46)
MCH: 30.2 pg (ref 26.0–34.0)
MCHC: 33.5 g/dL (ref 30.0–36.0)
MCV: 90.1 fL (ref 78.0–100.0)
MONO ABS: 0.7 10*3/uL (ref 0.1–1.0)
MONOS PCT: 8 % (ref 3–12)
MPV: 9.1 fL (ref 8.6–12.4)
Neutro Abs: 4.5 10*3/uL (ref 1.7–7.7)
Neutrophils Relative %: 50 % (ref 43–77)
Platelets: 416 10*3/uL — ABNORMAL HIGH (ref 150–400)
RBC: 2.42 MIL/uL — ABNORMAL LOW (ref 3.87–5.11)
RDW: 26.7 % — ABNORMAL HIGH (ref 11.5–15.5)
RH TYPE: POSITIVE
RUBELLA: 4.89 {index} — AB (ref <0.90)
WBC: 8.9 10*3/uL (ref 4.0–10.5)

## 2015-01-03 LAB — COMPREHENSIVE METABOLIC PANEL
ALBUMIN: 4.3 g/dL (ref 3.6–5.1)
ALT: 13 U/L (ref 6–29)
AST: 30 U/L (ref 10–30)
Alkaline Phosphatase: 47 U/L (ref 33–115)
BILIRUBIN TOTAL: 5.3 mg/dL — AB (ref 0.2–1.2)
BUN: 8 mg/dL (ref 7–25)
CHLORIDE: 104 mmol/L (ref 98–110)
CO2: 20 mmol/L (ref 20–31)
CREATININE: 0.51 mg/dL (ref 0.50–1.10)
Calcium: 8.9 mg/dL (ref 8.6–10.2)
Glucose, Bld: 101 mg/dL — ABNORMAL HIGH (ref 65–99)
Potassium: 3.9 mmol/L (ref 3.5–5.3)
SODIUM: 134 mmol/L — AB (ref 135–146)
TOTAL PROTEIN: 6.9 g/dL (ref 6.1–8.1)

## 2015-01-03 LAB — HIV ANTIBODY (ROUTINE TESTING W REFLEX): HIV: NONREACTIVE

## 2015-01-03 NOTE — Progress Notes (Signed)
Subjective:    [name] is a G1P0000 [redacted]w[redacted]d being seen today for her first obstetrical visit.  Her obstetrical history is significant for sickle cell (SS) disease, history of breast lump 2015, history of catheter associated DVT in 2006.Marland Kitchen Patient does intend to breast feed. Pregnancy history fully reviewed.  Patient reports nausea and vomiting.  Filed Vitals:   01/02/15 1420  BP: 109/69  Pulse: 95  Weight: 159 lb (72.122 kg)    HISTORY: OB History  Gravida Para Term Preterm AB SAB TAB Ectopic Multiple Living     # Outcome Date GA Lbr Len/2nd Weight Sex Delivery Anes PTL Lv  1 Current              Past Medical History  Diagnosis Date  . Hemoglobin S-S disease   . Ovarian cyst   . Jaundice   . Anemia   . Infertility associated with anovulation    Past Surgical History  Procedure Laterality Date  . Cholecystectomy  2001   Family History  Problem Relation Age of Onset  . Hypertension Mother   . Hypertension Father   . Cancer Paternal Uncle     stomach  . Cancer Sister     thyroid     Exam    Uterus:     Pelvic Exam:    Perineum: No Hemorrhoids   Vulva: normal   Vagina:  normal mucosa   pH: n/a   Cervix: nulliparous appearance   Adnexa: no mass, fullness, tenderness   Bony Pelvis: average  System: Breast:  normal appearance, no masses or tenderness   Skin: normal coloration and turgor, no rashes    Neurologic: oriented, normal mood   Extremities: normal strength, tone, and muscle mass   HEENT extra ocular movement intact and sclera clear, anicteric   Mouth/Teeth mucous membranes moist, pharynx normal without lesions and dental hygiene good   Neck supple and no masses   Cardiovascular: regular rate and rhythm   Respiratory:  appears well, vitals normal, no respiratory distress, acyanotic, normal RR, chest clear, no wheezing, crepitations, rhonchi, normal symmetric air entry   Abdomen: soft, non-tender; bowel sounds  normal; no masses,  no organomegaly   Urinary: urethral meatus normal      Assessment:    Pregnancy: G1P0000 Patient Active Problem List   Diagnosis Date Noted  . Left knee pain 12/15/2014  . Infertility, female 03/24/2014  . Palpitations 02/16/2014  . Dyspnea 02/13/2014  . Other fatigue 02/09/2014  . Vitamin D insufficiency 02/09/2014  . Cyst of right breast 08/19/2013  . Hb-SS disease without crisis 12/21/2012  . Sickle cell anemia 05/27/2012  . 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 Thromboembolism of vein 03/17/2012        Plan:     Initial labs drawn. Prenatal vitamins. Problem list reviewed and updated. Genetic Screening discussed First Screen: discussed.  Willorder at 10 weeks.  Ultrasound discussed; fetal survey: requested.  Follow up in 4 weeks.  Sickle Cell Disease  Plan for Sickle Cell Disease Prenatal Care  Limit iron supplementation Monthly CBC and CMP Folic acid 4 mg in addition to PNV Serial urine cultures for asymptomatic bacteruria Baby aspirin at 13 weeks until 5-10 days before [facility] SCDS and/or Lovenox with hospitalization to reduce risk of DVT Serial growth [place] 20,28, 32, 36 Serial evaluation for alloimmunization  Does not appear that pt got f/u mammogram in 7/16.  RN to call patient to  see if she got scan elsewhere.   LEGGETT,KELLY H. 01/03/2015

## 2015-01-03 NOTE — Telephone Encounter (Signed)
-----   Message from Lesly Dukes, MD sent at 01/03/2015 11:41 AM EDT ----- Pt was supposed to get a f/u mammogram in July 2016.  I see it as still ordered.  Can you call her and see if she got it elsewhere?  I would not proceed with mammo until after pregnancy (if she has not gotten it)

## 2015-01-03 NOTE — Telephone Encounter (Signed)
LM on voicemail to call office to let us know if she had her F/U mammogram in June as ordered and if so where?

## 2015-01-04 ENCOUNTER — Telehealth: Payer: Self-pay | Admitting: *Deleted

## 2015-01-04 LAB — CULTURE, URINE COMPREHENSIVE
COLONY COUNT: NO GROWTH
ORGANISM ID, BACTERIA: NO GROWTH

## 2015-01-04 LAB — URINE CYTOLOGY ANCILLARY ONLY
Chlamydia: NEGATIVE
NEISSERIA GONORRHEA: NEGATIVE

## 2015-01-04 NOTE — Telephone Encounter (Signed)
[facility] Candise Che 01/05/15 @ 11:15 to discuss Hgb o f7.2.  Pt is aware of appt.

## 2015-01-04 NOTE — Telephone Encounter (Signed)
-----   Message from Tara Dukes, MD sent at 01/04/2015  9:28 AM EDT ----- Regarding: FW: RBC Transfusions See below.  Please get her a very quick consult with the [place] hematologist listed below.    If still viable pregnancy in 2 weeks, make MFM consult.  KHL  ----- Message -----    From: Altha Harm, MD    Sent: 01/03/2015   3:32 PM      To: Tara Dukes, MD Subject: RBC Transfusions                               Hi [name], I am no longer in the out patient and am directing my care to in-patient. In general the Hb should be kept above 8 g/dl and Hb S <40% to prevent IUGR. However I know [name] well and she has known severe delayed transfusion reaction (not sure of her blood phenotype) and does not usually get blood. Her Hematologist is Oretha Milch at [place] who is currently on sabbatical. However when [name] and I last spoke in anticipation of her pregnancy the recommendation was no transfusions unless absolutely necessary. You may consider starting epogen and Iron and/or either involving [facility] or seeking Hematology assistance from either Dr. Bertis Ruddy or Candise Che at the [facility] [phone]). If I can be of any further assistance call me on my cell at [phone].  Marcelino Duster

## 2015-01-04 NOTE — Telephone Encounter (Signed)
Pt returned my call yesterday to state that she never had her f/u  mammogram that Dr leggett ordered in June.  She is aware to hold off until after the pregnancy.  Pt agrees.

## 2015-01-05 ENCOUNTER — Ambulatory Visit: Payer: BLUE CROSS/BLUE SHIELD | Admitting: Hematology

## 2015-01-16 ENCOUNTER — Other Ambulatory Visit: Payer: BLUE CROSS/BLUE SHIELD

## 2015-01-16 ENCOUNTER — Telehealth: Payer: Self-pay | Admitting: *Deleted

## 2015-01-16 DIAGNOSIS — Z3200 Encounter for pregnancy test, result unknown: Secondary | ICD-10-CM

## 2015-01-16 DIAGNOSIS — D649 Anemia, unspecified: Secondary | ICD-10-CM

## 2015-01-16 DIAGNOSIS — O209 Hemorrhage in early pregnancy, unspecified: Secondary | ICD-10-CM

## 2015-01-16 NOTE — Progress Notes (Signed)
Pt here for viability bedside U/S.  U/S today shows non-viable pregnancy.  Pt stated that she did have some light spotting this morning but denies any cramping.  Formal U/S scheduled for 01/17/15 @ 9:45 and then pt is to come here for F/U with Dr Penne Lash.

## 2015-01-16 NOTE — Telephone Encounter (Signed)
Spoke with Hematology @ [facility] and outside ambulatory referral put in Epic

## 2015-01-17 ENCOUNTER — Ambulatory Visit (INDEPENDENT_AMBULATORY_CARE_PROVIDER_SITE_OTHER): Payer: PRIVATE HEALTH INSURANCE

## 2015-01-17 ENCOUNTER — Ambulatory Visit (INDEPENDENT_AMBULATORY_CARE_PROVIDER_SITE_OTHER): Payer: PRIVATE HEALTH INSURANCE | Admitting: Obstetrics & Gynecology

## 2015-01-17 ENCOUNTER — Encounter: Payer: Self-pay | Admitting: Obstetrics & Gynecology

## 2015-01-17 VITALS — BP 120/71 | HR 94 | Resp 16 | Ht 61.0 in | Wt 154.0 lb

## 2015-01-17 DIAGNOSIS — D571 Sickle-cell disease without crisis: Secondary | ICD-10-CM | POA: Diagnosis not present

## 2015-01-17 DIAGNOSIS — O021 Missed abortion: Secondary | ICD-10-CM

## 2015-01-17 DIAGNOSIS — Z3A01 Less than 8 weeks gestation of pregnancy: Secondary | ICD-10-CM | POA: Diagnosis not present

## 2015-01-17 DIAGNOSIS — O209 Hemorrhage in early pregnancy, unspecified: Secondary | ICD-10-CM

## 2015-01-17 DIAGNOSIS — T80911D Delayed hemolytic transfusion reaction, unspecified incompatibility, subsequent encounter: Secondary | ICD-10-CM | POA: Diagnosis not present

## 2015-01-17 DIAGNOSIS — Z3200 Encounter for pregnancy test, result unknown: Secondary | ICD-10-CM

## 2015-01-18 ENCOUNTER — Encounter (HOSPITAL_COMMUNITY): Payer: Self-pay | Admitting: Emergency Medicine

## 2015-01-18 ENCOUNTER — Telehealth: Payer: Self-pay | Admitting: *Deleted

## 2015-01-18 ENCOUNTER — Emergency Department (HOSPITAL_COMMUNITY)
Admission: EM | Admit: 2015-01-18 | Discharge: 2015-01-18 | Disposition: A | Discharge status: Other Acute Inpatient Hospital | Payer: PRIVATE HEALTH INSURANCE | Attending: Emergency Medicine | Admitting: Emergency Medicine

## 2015-01-18 DIAGNOSIS — O039 Complete or unspecified spontaneous abortion without complication: Secondary | ICD-10-CM | POA: Diagnosis present

## 2015-01-18 DIAGNOSIS — Z8742 Personal history of other diseases of the female genital tract: Secondary | ICD-10-CM | POA: Insufficient documentation

## 2015-01-18 DIAGNOSIS — Z79899 Other long term (current) drug therapy: Secondary | ICD-10-CM | POA: Insufficient documentation

## 2015-01-18 DIAGNOSIS — D5700 Hb-SS disease with crisis, unspecified: Secondary | ICD-10-CM | POA: Diagnosis not present

## 2015-01-18 DIAGNOSIS — Z792 Long term (current) use of antibiotics: Secondary | ICD-10-CM | POA: Insufficient documentation

## 2015-01-18 LAB — CBC WITH DIFFERENTIAL/PLATELET
BASOS ABS: 0.1 10*3/uL (ref 0.0–0.1)
Basophils Relative: 1 %
Eosinophils Absolute: 0.1 10*3/uL (ref 0.0–0.7)
Eosinophils Relative: 1 %
HEMATOCRIT: 22.5 % — AB (ref 36.0–46.0)
HEMOGLOBIN: 8 g/dL — AB (ref 12.0–15.0)
LYMPHS PCT: 39 %
Lymphs Abs: 3.1 10*3/uL (ref 0.7–4.0)
MCH: 29.9 pg (ref 26.0–34.0)
MCHC: 35.6 g/dL (ref 30.0–36.0)
MCV: 84 fL (ref 78.0–100.0)
MONOS PCT: 8 %
Monocytes Absolute: 0.6 10*3/uL (ref 0.1–1.0)
Neutro Abs: 4 10*3/uL (ref 1.7–7.7)
Neutrophils Relative %: 51 %
Platelets: ADEQUATE 10*3/uL (ref 150–400)
RBC: 2.68 MIL/uL — AB (ref 3.87–5.11)
RDW: 25.2 % — ABNORMAL HIGH (ref 11.5–15.5)
WBC: 7.9 10*3/uL (ref 4.0–10.5)

## 2015-01-18 LAB — I-STAT CHEM 8, ED
BUN: 9 mg/dL (ref 6–20)
CHLORIDE: 104 mmol/L (ref 101–111)
CREATININE: 0.4 mg/dL — AB (ref 0.44–1.00)
Calcium, Ion: 1.18 mmol/L (ref 1.12–1.23)
GLUCOSE: 150 mg/dL — AB (ref 65–99)
HCT: 26 % — ABNORMAL LOW (ref 36.0–46.0)
Hemoglobin: 8.8 g/dL — ABNORMAL LOW (ref 12.0–15.0)
POTASSIUM: 3.4 mmol/L — AB (ref 3.5–5.1)
Sodium: 138 mmol/L (ref 135–145)
TCO2: 19 mmol/L (ref 0–100)

## 2015-01-18 LAB — ABO/RH: ABO/RH(D): O POS

## 2015-01-18 LAB — I-STAT BETA HCG BLOOD, ED (MC, WL, AP ONLY): I-stat hCG, quantitative: >2000 m[IU]/mL — ABNORMAL HIGH (ref <5)

## 2015-01-18 MED ORDER — LIDOCAINE HCL (PF) 1 % IJ SOLN
INTRAMUSCULAR | Status: AC
  Filled 2015-01-18: qty 5

## 2015-01-18 MED ORDER — MORPHINE SULFATE (PF) 4 MG/ML IV SOLN
4.0000 mg | Freq: Once | INTRAVENOUS | Status: AC
  Administered 2015-01-18: 4 mg via INTRAVENOUS
  Filled 2015-01-18: qty 1

## 2015-01-18 MED ORDER — LIDOCAINE HCL (PF) 1 % IJ SOLN: 2.0000 mL | Freq: Once | INTRAMUSCULAR | Status: DC

## 2015-01-18 MED ORDER — HYDROMORPHONE HCL 1 MG/ML IJ SOLN
1.0000 mg | Freq: Once | INTRAMUSCULAR | Status: AC
Start: 2015-01-18 — End: 2015-01-18
  Administered 2015-01-18: 1 mg via INTRAVENOUS
  Filled 2015-01-18: qty 1

## 2015-01-18 MED ORDER — POTASSIUM CHLORIDE CRYS ER 20 MEQ PO TBCR
40.0000 meq | EXTENDED_RELEASE_TABLET | Freq: Once | ORAL | Status: AC
  Administered 2015-01-18: 40 meq via ORAL
  Filled 2015-01-18: qty 2

## 2015-01-18 NOTE — Telephone Encounter (Signed)
Pt called and has an appt with Dr Lady DeutscherAndra James @ [facility] 01/22/15 @ 8 AM  [phone].

## 2015-01-18 NOTE — Telephone Encounter (Signed)
LM on voicemail if she starts having bleeding or cramping to go ahead a go to [facility] for evaluation.

## 2015-01-18 NOTE — ED Notes (Signed)
Patient here with vaginal bleeding and cramping.  Patient states that the bleeding started yesterday and cramping started today.  Patient states she is [redacted] weeks along at this time.  Patient did see her OBGYN yesterday.  Patient is schedule for D&C on Monday.  Patient states she is going thru one pad an hour at this time and having clots.  Patient states she has sickle cell disease.  Patient is having some nausea and vomiting.

## 2015-01-18 NOTE — ED Provider Notes (Signed)
CSN: [id number]     Arrival date & time 01/18/15  1918 History   First MD Initiated Contact with Patient 01/18/15 1948     Chief Complaint  Patient presents with  . Miscarriage    Chief complaint "I'm having a miscarriage" (Consider location/radiation/quality/duration/timing/severity/associated sxs/prior Treatment) HPI Patient developed lower abdominal crampy pain 3 hours ago. Pain is constant. Symptoms accompanied by vaginal bleeding. Patient reports she was seen by Dr.Leggette yesterday, determined to have fetal demise. She is scheduled to have elective D&C on 01/22/2015 at [facility]. No other associated symptoms. She presents the emergency department due to severe crampy abdominal pain. Past Medical History  Diagnosis Date  . Hemoglobin S-S disease (HCC)   . Ovarian cyst   . Jaundice   . Anemia   . Infertility associated with anovulation    Past Surgical History  Procedure Laterality Date  . Cholecystectomy  2001   Family History  Problem Relation Age of Onset  . Hypertension Mother   . Hypertension Father   . Cancer Paternal Uncle     stomach  . Cancer Sister     thyroid   Social History  Substance Use Topics  . Smoking status: Never Smoker   . Smokeless tobacco: Never Used  . Alcohol Use: No   OB History    Gravida Para Term Preterm AB TAB SAB Ectopic Multiple Living       Review of Systems  Gastrointestinal: Positive for abdominal pain.  Genitourinary: Positive for vaginal bleeding.       Pregnant with fetal demise  Allergic/Immunologic: Positive for immunocompromised state.       Sickle cell disease  All other systems reviewed and are negative.     Allergies  Review of patient's allergies indicates no known allergies.  Home Medications   Prior to Admission medications   Medication Sig Start Date End Date Taking? Authorizing Provider  Doxylamine-Pyridoxine (DICLEGIS) 10-10 MG TBEC Take 2 tablets by mouth at  bedtime. 01/02/15   Lesly Dukes, MD  folic acid (FOLVITE) 1 MG tablet Take 4 tablets (4 mg total) by mouth daily. 01/02/15   Lesly Dukes, MD  Prenatal Vit-Fe Fumarate-FA (PRENATAL MULTIVITAMIN) TABS tablet Take 1 tablet by mouth daily at 12 noon.    Historical Provider, MD   BP 130/81 mmHg  Pulse 94  Temp(Src) 98.1 F (36.7 C) (Oral)  Resp 20  SpO2 99%  LMP 11/09/2014 Physical Exam  Constitutional: She appears well-developed and well-nourished. She appears distressed.  Appears uncomfortable  HENT:  Head: Normocephalic and atraumatic.  Eyes: Conjunctivae are normal. Pupils are equal, round, and reactive to light.  Neck: Neck supple. No tracheal deviation present. No thyromegaly present.  Cardiovascular: Normal rate and regular rhythm.   No murmur heard. Pulmonary/Chest: Effort normal and breath sounds normal.  Abdominal: Soft. Bowel sounds are normal. She exhibits no distension. There is no tenderness.  Genitourinary:  Tissue protruding from cervical os. No adnexal tenderness, mild uterine fundus tenderness. I told tissue out of cervical os with ring forceps. There was moderate vaginal bleeding after tissue pulled out. Cervical os could admit a fingertip.  Musculoskeletal: Normal range of motion. She exhibits no edema or tenderness.  Neurological: She is alert. Coordination normal.  Skin: Skin is warm and dry. No rash noted.  Psychiatric: She has a normal mood and affect.  Nursing note and vitals reviewed.   ED Course  Procedures (including critical  care time) Labs Review Labs Reviewed  CBC WITH DIFFERENTIAL/PLATELET  I-STAT BETA HCG BLOOD, ED ([facility], [facility], [facility] ONLY)  I-STAT CHEM 8, ED  ABO/RH    Imaging Review US Ob Transvaginal  01/17/2015  ADDENDUM REPORT: 01/17/2015 12:32 ADDENDUM: The examination was also discussed with Dr. Penne Lash after interpretation on 01/17/2015. She discussed that the patient had a prior in office ultrasound which demonstrated a live intrauterine  gestation with a heart beat. As there is now no heart beat, this would be compatible with fetal demise given the interval change. Her findings are documented on office note 01/02/2015 at 1538 hours. Note this interpretation was discussed on the phone with Dr. Penne Lash however I did not have access to prior in office ultrasound images, only the report. Electronically Signed   By: Annia Belt M.D.   On: 01/17/2015 12:32  01/17/2015  CLINICAL DATA:  Unconfirmed pregnancy.  Uterine bleeding. EXAM: TRANSVAGINAL OB ULTRASOUND TECHNIQUE: Transvaginal ultrasound was performed for complete evaluation of the gestation as well as the maternal uterus, adnexal regions, and pelvic cul-de-sac. COMPARISON:  None. FINDINGS: Intrauterine gestational sac: Present Yolk sac:  Not visualized Embryo:  Present Cardiac Activity: Not present Heart Rate: 0 bpm CRL:   5  mm   6 w 2d Maternal uterus/adnexae: The right ovary measures 3.6 x 2.5 x 2.5 cm. Within the right ovary there is a small focal echogenic lesion measuring 7 mm. The left ovary is not visualized due to overlying bowel gas. IMPRESSION: Intrauterine gestation with crown-rump length of 5 mm. No heartbeat identified. Findings are suspicious but not yet definitive for failed pregnancy. Recommend follow-up US in 10-14 days for definitive diagnosis. This recommendation follows SRU consensus guidelines: Diagnostic Criteria for Nonviable Pregnancy Early in the First Trimester. Malva Limes Med 2013; 409:8119-14. Echogenic focus within the right ovary which may represent a small dermoid. Findings discussed with Karie Fetch, RN at 11 a.m. on 01/17/2015. Electronically Signed: By: Annia Belt M.D. On: 01/17/2015 11:04   I have personally reviewed and evaluated these images and lab results as part of my medical decision-making.   EKG Interpretation None     9 PM pain improved after treatment with intravenous opioids.  9:50 PM patient remains comfortable alert Glasgow Coma Score  15. Nursing was unable to establish peripheral IV access Angiocath insertion Performed by: Doug Sou  Consent: Verbal consent obtained. Risks and benefits: risks, benefits and alternatives were discussed Time out: Immediately prior to procedure a "time out" was called to verify the correct patient, procedure, equipment, support staff and site/side marked as required.  Preparation: Patient was prepped and draped in the usual sterile fashion.  Vein Location: right external jugular    Gauge: 20  Normal blood return and flush without difficulty Patient tolerance: Patient tolerated the procedure well with no immediate complications.  Results for orders placed or performed during the hospital encounter of 01/18/15  CBC with Differential  Result Value Ref Range   WBC 7.9 4.0 - 10.5 K/uL   RBC 2.68 (L) 3.87 - 5.11 MIL/uL   Hemoglobin 8.0 (L) 12.0 - 15.0 g/dL   HCT 78.2 (L) 95.6 - 21.3 %   MCV 84.0 78.0 - 100.0 fL   MCH 29.9 26.0 - 34.0 pg   MCHC 35.6 30.0 - 36.0 g/dL   RDW 08.6 (H) 57.8 - 46.9 %   Platelets  150 - 400 K/uL    PLATELET CLUMPS NOTED ON SMEAR, COUNT APPEARS ADEQUATE   Neutrophils Relative % 51 %  Lymphocytes Relative 39 %   Monocytes Relative 8 %   Eosinophils Relative 1 %   Basophils Relative 1 %   Neutro Abs 4.0 1.7 - 7.7 K/uL   Lymphs Abs 3.1 0.7 - 4.0 K/uL   Monocytes Absolute 0.6 0.1 - 1.0 K/uL   Eosinophils Absolute 0.1 0.0 - 0.7 K/uL   Basophils Absolute 0.1 0.0 - 0.1 K/uL   RBC Morphology POLYCHROMASIA PRESENT   I-Stat beta hCG blood, ED ([facility], [facility], [facility] only)  Result Value Ref Range   I-stat hCG, quantitative >2000.0 (H) <5 mIU/mL   Comment 3          I-Stat Chem 8, ED  Result Value Ref Range   Sodium 138 135 - 145 mmol/L   Potassium 3.4 (L) 3.5 - 5.1 mmol/L   Chloride 104 101 - 111 mmol/L   BUN 9 6 - 20 mg/dL   Creatinine, Ser 1.61 (L) 0.44 - 1.00 mg/dL   Glucose, Bld 096 (H) 65 - 99 mg/dL   Calcium, Ion 0.45 4.09 - 1.23 mmol/L   TCO2 19 0 -  100 mmol/L   Hemoglobin 8.8 (L) 12.0 - 15.0 g/dL   HCT 81.1 (L) 91.4 - 78.2 %  ABO/Rh  Result Value Ref Range   ABO/RH(D) O POS    No rh immune globuloin NOT A RH IMMUNE GLOBULIN CANDIDATE, PT RH POSITIVE    US Ob Transvaginal  01/17/2015  ADDENDUM REPORT: 01/17/2015 12:32 ADDENDUM: The examination was also discussed with Dr. Penne Lash after interpretation on 01/17/2015. She discussed that the patient had a prior in office ultrasound which demonstrated a live intrauterine gestation with a heart beat. As there is now no heart beat, this would be compatible with fetal demise given the interval change. Her findings are documented on office note 01/02/2015 at 1538 hours. Note this interpretation was discussed on the phone with Dr. Penne Lash however I did not have access to prior in office ultrasound images, only the report. Electronically Signed   By: Annia Belt M.D.   On: 01/17/2015 12:32  01/17/2015  CLINICAL DATA:  Unconfirmed pregnancy.  Uterine bleeding. EXAM: TRANSVAGINAL OB ULTRASOUND TECHNIQUE: Transvaginal ultrasound was performed for complete evaluation of the gestation as well as the maternal uterus, adnexal regions, and pelvic cul-de-sac. COMPARISON:  None. FINDINGS: Intrauterine gestational sac: Present Yolk sac:  Not visualized Embryo:  Present Cardiac Activity: Not present Heart Rate: 0 bpm CRL:   5  mm   6 w 2d Maternal uterus/adnexae: The right ovary measures 3.6 x 2.5 x 2.5 cm. Within the right ovary there is a small focal echogenic lesion measuring 7 mm. The left ovary is not visualized due to overlying bowel gas. IMPRESSION: Intrauterine gestation with crown-rump length of 5 mm. No heartbeat identified. Findings are suspicious but not yet definitive for failed pregnancy. Recommend follow-up US in 10-14 days for definitive diagnosis. This recommendation follows SRU consensus guidelines: Diagnostic Criteria for Nonviable Pregnancy Early in the First Trimester. Malva Limes Med 2013; 956:2130-86.  Echogenic focus within the right ovary which may represent a small dermoid. Findings discussed with Karie Fetch, RN at 11 a.m. on 01/17/2015. Electronically Signed: By: Annia Belt M.D. On: 01/17/2015 11:04    MDM  I spoke with Dr. Macon Large . Plan transfer to [facility] for observation in vcase pt needs blood transfusion and d and c. She has had bad transfusuion reactions in past, and hematologists [facility]/ Dr. Phebe Colla is accepting MD Dx#1 miscarriage #  2 anemia #3 hypokalemia Final diagnoses:  None        Doug SouSam Christella App, MD 01/18/15 2156

## 2015-01-19 ENCOUNTER — Encounter: Payer: Self-pay | Admitting: Obstetrics & Gynecology

## 2015-01-19 NOTE — Progress Notes (Signed)
Patient present for follow up of US and plan for upcoming pregnancy.  Last heart rate was 92 which las low, but could be normal given EGA of fetus.  Pt has some spotting x1 day.  No cramping.  No heavy bleeding.    US shows no FH and consistent with missed AB.  Pt is visibly upset.  This is her first pregnancy and she had difficult becoming pregnant.  Of note, patient can not receive blood transfusion due to her delayed transfusion reaction.  Per Dr. Ashley RoyaltyMatthews, she was near death after her last transfusion.  She reuired transfer to [facility] in the  Middle of her crisis and received IVIG.  She recovered well.    Last contact with [facility] dept [facility] (01/05/15) revealed that they were not comfortable taking care of Karena Addisonrene due to her complexities of SS disease.  Given this, Karena Addisonrene would [facility] where she has a Holiday representativeperinatologist and hematologist who are familiar and comfortable with her conditions.  She has appointment on October 17th.  If she experiences bleeding or cramping, [facility] should [facility] (if she feels she can safely get there--approximately 1 hour from her house).  Will get formal MFM consult about future pregnancies and safety.    25 minutes was spent face to face with patient and coordination of care, >50% counseling.

## 2015-01-25 ENCOUNTER — Encounter: Payer: Self-pay | Admitting: Obstetrics & Gynecology

## 2015-01-30 ENCOUNTER — Encounter: Payer: BLUE CROSS/BLUE SHIELD | Admitting: Obstetrics & Gynecology

## 2015-03-06 ENCOUNTER — Ambulatory Visit (INDEPENDENT_AMBULATORY_CARE_PROVIDER_SITE_OTHER): Payer: PRIVATE HEALTH INSURANCE | Admitting: Obstetrics & Gynecology

## 2015-03-06 ENCOUNTER — Encounter: Payer: Self-pay | Admitting: Obstetrics & Gynecology

## 2015-03-06 VITALS — BP 110/66 | HR 81 | Resp 16 | Ht 61.0 in | Wt 152.0 lb

## 2015-03-06 DIAGNOSIS — O039 Complete or unspecified spontaneous abortion without complication: Secondary | ICD-10-CM | POA: Diagnosis not present

## 2015-03-06 DIAGNOSIS — D571 Sickle-cell disease without crisis: Secondary | ICD-10-CM

## 2015-03-06 LAB — POCT URINE PREGNANCY: Preg Test, Ur: NEGATIVE

## 2015-03-06 MED ORDER — LETROZOLE 2.5 MG PO TABS: 2.5000 mg | ORAL_TABLET | Freq: Every day | ORAL | Status: DC

## 2015-03-07 ENCOUNTER — Encounter: Payer: PRIVATE HEALTH INSURANCE | Admitting: Obstetrics & Gynecology

## 2015-03-07 DIAGNOSIS — E559 Vitamin D deficiency, unspecified: Secondary | ICD-10-CM

## 2015-03-07 NOTE — Progress Notes (Signed)
   Subjective:    Patient ID: [name], female    DOB: 11/04/1980, 34 y.o.   MRN: [id number]  HPI  Pt presents for f/u of 7 week miscarriage.  UPT negative today.  She has had one menses.  Pt would like to begin trying to conceive again.  Recent article (ABOG 2016 re certification list) noted increased fertility and live birth without waiting 3 months before initiating trying to conceive.  Pt conceived with Femara last time.  Will prescribe again.    Reveiwed pathology from ED--POC Genetics not done Review[facility].  Read allowed with patient and she understands risks of pregnancy.  Pt will make appt with MFM and make sure no recommendations have changed in past 18 months.    Review of Systems  Constitutional: Negative.   Cardiovascular: Negative.   Gastrointestinal: Negative.   Genitourinary: Negative.   Psychiatric/Behavioral: Negative.        Objective:   Physical Exam  Constitutional: She is oriented to person, place, and time. She appears well-developed and well-nourished. No distress.  HENT:  Head: Normocephalic and atraumatic.  Eyes: Conjunctivae are normal.  Pulmonary/Chest: Effort normal.  Abdominal: Soft.  Musculoskeletal: She exhibits no edema.  Neurological: She is alert and oriented to person, place, and time.  Skin: Skin is warm and dry.  Psychiatric: She has a normal mood and affect.  Vitals reviewed.  Filed Vitals:   03/06/15 1545  BP: 110/66  Pulse: 81  Resp: 16  Height: 5\' 1"  (1.549 m)  Weight: 152 lb (68.947 kg)           Assessment & Plan:  34 yo female with SS disease and desire to conceive. Recent 7 week miscarriage--resolved.  1-[facility] 2-MFM appt 3-PNV and folic acid 4-Femara prescription 5-UPT negative today.  25 minutes spent face to face with patient with >50% counseling.

## 2015-03-08 ENCOUNTER — Telehealth: Payer: Self-pay | Admitting: *Deleted

## 2015-03-08 NOTE — Telephone Encounter (Signed)
LM on voicemail to call office to schedule a time to come in for a Vitamin D level per Dr Penne LashLeggett.

## 2015-03-19 ENCOUNTER — Ambulatory Visit: Payer: BLUE CROSS/BLUE SHIELD | Admitting: Internal Medicine

## 2015-03-22 NOTE — Telephone Encounter (Signed)
Pt called in wanting [facility] at [facility] - placed order and sent to pt.

## 2015-04-11 ENCOUNTER — Encounter: Payer: Self-pay | Admitting: *Deleted

## 2015-04-29 ENCOUNTER — Encounter: Payer: Self-pay | Admitting: Obstetrics & Gynecology

## 2015-04-30 ENCOUNTER — Other Ambulatory Visit: Payer: Self-pay | Admitting: Obstetrics & Gynecology

## 2015-04-30 MED ORDER — LETROZOLE 2.5 MG PO TABS
5.0000 mg | ORAL_TABLET | Freq: Every day | ORAL | Status: DC
Start: 2015-04-30 — End: 2015-07-26

## 2015-06-14 ENCOUNTER — Ambulatory Visit: Payer: BLUE CROSS/BLUE SHIELD | Admitting: Family Medicine

## 2015-06-25 ENCOUNTER — Other Ambulatory Visit: Payer: Self-pay | Admitting: Obstetrics & Gynecology

## 2015-06-25 ENCOUNTER — Encounter: Payer: Self-pay | Admitting: Obstetrics & Gynecology

## 2015-07-24 ENCOUNTER — Encounter: Payer: Self-pay | Admitting: Obstetrics & Gynecology

## 2015-07-24 ENCOUNTER — Other Ambulatory Visit: Payer: Self-pay | Admitting: Obstetrics & Gynecology

## 2015-07-25 ENCOUNTER — Other Ambulatory Visit: Payer: Self-pay | Admitting: Obstetrics & Gynecology

## 2015-07-26 ENCOUNTER — Other Ambulatory Visit: Payer: Self-pay | Admitting: *Deleted

## 2015-07-26 DIAGNOSIS — N970 Female infertility associated with anovulation: Secondary | ICD-10-CM

## 2015-07-26 MED ORDER — LETROZOLE 2.5 MG PO TABS
ORAL_TABLET | ORAL | Status: DC
Start: 2015-07-26 — End: 2015-07-27

## 2015-07-26 NOTE — Telephone Encounter (Signed)
RF authorization given for Femara 5 mg per Dr leggett.  Pt was requesting her Femara be raised to 7.5 mg but that was not granted.

## 2015-07-27 ENCOUNTER — Other Ambulatory Visit: Payer: Self-pay | Admitting: Obstetrics & Gynecology

## 2015-07-27 DIAGNOSIS — N970 Female infertility associated with anovulation: Secondary | ICD-10-CM

## 2015-07-27 MED ORDER — LETROZOLE 2.5 MG PO TABS: ORAL_TABLET | ORAL | Status: DC

## 2015-07-30 ENCOUNTER — Telehealth: Payer: Self-pay | Admitting: *Deleted

## 2015-07-30 NOTE — Telephone Encounter (Signed)
Lm on voicemail that Femara has been increased to 7.5 mg daily for 5 days per Dr leggett.

## 2015-09-05 ENCOUNTER — Ambulatory Visit (INDEPENDENT_AMBULATORY_CARE_PROVIDER_SITE_OTHER): Payer: Federal, State, Local not specified - PPO | Admitting: Obstetrics & Gynecology

## 2015-09-05 ENCOUNTER — Encounter: Payer: Self-pay | Admitting: Obstetrics & Gynecology

## 2015-09-05 VITALS — BP 124/70 | HR 93 | Resp 16 | Ht 61.0 in | Wt 150.0 lb

## 2015-09-05 DIAGNOSIS — N979 Female infertility, unspecified: Secondary | ICD-10-CM

## 2015-09-05 DIAGNOSIS — D5780 Other sickle-cell disorders without crisis: Secondary | ICD-10-CM | POA: Diagnosis not present

## 2015-09-05 DIAGNOSIS — Z01419 Encounter for gynecological examination (general) (routine) without abnormal findings: Secondary | ICD-10-CM

## 2015-09-06 NOTE — Progress Notes (Signed)
  Subjective:     [name] is a 35 y.o. female here for a routine exam.  Current complaints: trying to conceive.  Unsuccessful with Femara.  Pt encouraged to see REI and complete workup and further assistance.  Sickle Cell managed by [facility].    Gynecologic History Patient's last menstrual period was 08/25/2015. Contraception: none Last Pap: 2105. Results were: normal Last mammogram: n/a  Obstetric History OB History  Gravida Para Term Preterm AB SAB TAB Ectopic Multiple Living  1 0 0 0 1 1 0 0 0 0     # Outcome Date GA Lbr Len/2nd Weight Sex Delivery Anes PTL Lv  1 SAB                The following portions of the patient's history were reviewed and updated as appropriate: allergies, current medications, past family history, past medical history, past social history, past surgical history and problem list.  Review of Systems Pertinent items noted in HPI and remainder of comprehensive ROS otherwise negative.    Objective:      Filed Vitals:   09/05/15 1015  BP: 124/70  Pulse: 93  Resp: 16  Height: 5\' 1"  (1.549 m)  Weight: 150 lb (68.04 kg)   Vitals:  WNL General appearance: alert, cooperative and no distress  HEENT: Normocephalic, without obvious abnormality, atraumatic Eyes: negative Throat: lips, mucosa, and tongue normal; teeth and gums normal  Respiratory: Clear to auscultation bilaterally  CV: Regular rate and rhythm  Breasts:  Normal appearance, no masses or tenderness, no nipple retraction or dimpling  GI: Soft, non-tender; bowel sounds normal; no masses,  no organomegaly  GU: External Genitalia:  Tanner V, no lesion Urethra:  No prolapse   Vagina: Pink, normal rugae, no blood or discharge  Cervix: No CMT, no lesion  Uterus:  Normal size and contour, non tender (difficult due to habitus)  Adnexa: Normal, no masses, non tender (difficult due to habitus)  Musculoskeletal: No edema, redness or tenderness in the calves or thighs  Skin: No lesions or  rash  Lymphatic: Axillary adenopathy: none    Psychiatric: Normal mood and behavior   Assessment:    Healthy female exam.   Sickle Cell [facility] Infertility   Plan:    Pap due 2018 Referral to Dr. Jeannie FendY Continue folic acid

## 2015-09-25 ENCOUNTER — Encounter: Payer: Self-pay | Admitting: Obstetrics & Gynecology

## 2015-09-26 ENCOUNTER — Other Ambulatory Visit: Payer: Self-pay | Admitting: *Deleted

## 2015-09-26 DIAGNOSIS — N970 Female infertility associated with anovulation: Secondary | ICD-10-CM

## 2015-09-26 MED ORDER — LETROZOLE 2.5 MG PO TABS: ORAL_TABLET | ORAL | Status: DC

## 2015-09-26 NOTE — Telephone Encounter (Signed)
Pt called with her appt to see Dr April MansonYalcinkaya in Aug.  She wants to continue her Femara until she sees him.  RF authorized for Femara.

## 2015-11-20 ENCOUNTER — Other Ambulatory Visit: Payer: Self-pay | Admitting: Obstetrics & Gynecology

## 2015-11-20 DIAGNOSIS — N970 Female infertility associated with anovulation: Secondary | ICD-10-CM

## 2015-12-25 ENCOUNTER — Non-Acute Institutional Stay (HOSPITAL_COMMUNITY)
Admission: AD | Admit: 2015-12-25 | Discharge: 2015-12-25 | Disposition: A | Discharge status: Home / Self Care | Payer: Federal, State, Local not specified - PPO | Source: Ambulatory Visit | Attending: Internal Medicine | Admitting: Internal Medicine

## 2015-12-25 ENCOUNTER — Telehealth (HOSPITAL_COMMUNITY): Payer: Self-pay | Admitting: *Deleted

## 2015-12-25 ENCOUNTER — Encounter (HOSPITAL_COMMUNITY): Payer: Self-pay | Admitting: *Deleted

## 2015-12-25 ENCOUNTER — Other Ambulatory Visit (HOSPITAL_COMMUNITY): Payer: Self-pay | Admitting: *Deleted

## 2015-12-25 DIAGNOSIS — Z79899 Other long term (current) drug therapy: Secondary | ICD-10-CM | POA: Insufficient documentation

## 2015-12-25 DIAGNOSIS — R52 Pain, unspecified: Secondary | ICD-10-CM | POA: Diagnosis present

## 2015-12-25 DIAGNOSIS — D5700 Hb-SS disease with crisis, unspecified: Secondary | ICD-10-CM | POA: Diagnosis not present

## 2015-12-25 LAB — COMPREHENSIVE METABOLIC PANEL
ALBUMIN: 4.4 g/dL (ref 3.5–5.0)
ALK PHOS: 65 U/L (ref 38–126)
ALT: 19 U/L (ref 14–54)
ANION GAP: 7 (ref 5–15)
AST: 54 U/L — ABNORMAL HIGH (ref 15–41)
BUN: 8 mg/dL (ref 6–20)
CHLORIDE: 104 mmol/L (ref 101–111)
CO2: 26 mmol/L (ref 22–32)
Calcium: 9.2 mg/dL (ref 8.9–10.3)
Creatinine, Ser: 0.37 mg/dL — ABNORMAL LOW (ref 0.44–1.00)
GFR calc Af Amer: >60 mL/min (ref >60)
GFR calc non Af Amer: >60 mL/min (ref >60)
GLUCOSE: 120 mg/dL — AB (ref 65–99)
POTASSIUM: 4 mmol/L (ref 3.5–5.1)
SODIUM: 137 mmol/L (ref 135–145)
Total Bilirubin: 5 mg/dL — ABNORMAL HIGH (ref 0.3–1.2)
Total Protein: 8.4 g/dL — ABNORMAL HIGH (ref 6.5–8.1)

## 2015-12-25 LAB — CBC WITH DIFFERENTIAL/PLATELET
BASOS PCT: 1 %
Basophils Absolute: 0.1 10*3/uL (ref 0.0–0.1)
EOS PCT: 1 %
Eosinophils Absolute: 0.1 10*3/uL (ref 0.0–0.7)
HEMATOCRIT: 21.7 % — AB (ref 36.0–46.0)
Hemoglobin: 7.6 g/dL — ABNORMAL LOW (ref 12.0–15.0)
Lymphocytes Relative: 27 %
Lymphs Abs: 2 10*3/uL (ref 0.7–4.0)
MCH: 27.9 pg (ref 26.0–34.0)
MCHC: 35 g/dL (ref 30.0–36.0)
MCV: 79.8 fL (ref 78.0–100.0)
Monocytes Absolute: 0.6 10*3/uL (ref 0.1–1.0)
Monocytes Relative: 8 %
NEUTROS PCT: 63 %
Neutro Abs: 4.7 10*3/uL (ref 1.7–7.7)
PLATELETS: 307 10*3/uL (ref 150–400)
RBC: 2.72 MIL/uL — AB (ref 3.87–5.11)
RDW: 27.7 % — ABNORMAL HIGH (ref 11.5–15.5)
WBC: 7.5 10*3/uL (ref 4.0–10.5)

## 2015-12-25 LAB — RETICULOCYTES
RBC.: 2.72 MIL/uL — AB (ref 3.87–5.11)
RETIC COUNT ABSOLUTE: 459.7 10*3/uL — AB (ref 19.0–186.0)
Retic Ct Pct: 16.9 % — ABNORMAL HIGH (ref 0.4–3.1)

## 2015-12-25 MED ORDER — PROMETHAZINE HCL 12.5 MG RE SUPP: 12.5000 mg | RECTAL | Status: DC | PRN

## 2015-12-25 MED ORDER — HYDROMORPHONE HCL 2 MG/ML IJ SOLN
1.0000 mg | Freq: Once | INTRAMUSCULAR | Status: AC
  Administered 2015-12-25: 1 mg via INTRAVENOUS
  Filled 2015-12-25: qty 1

## 2015-12-25 MED ORDER — SENNOSIDES-DOCUSATE SODIUM 8.6-50 MG PO TABS: 1.0000 | ORAL_TABLET | Freq: Two times a day (BID) | ORAL | Status: DC

## 2015-12-25 MED ORDER — DIPHENHYDRAMINE HCL 25 MG PO CAPS
25.0000 mg | ORAL_CAPSULE | ORAL | Status: DC | PRN
  Administered 2015-12-25: 25 mg via ORAL
  Filled 2015-12-25: qty 1

## 2015-12-25 MED ORDER — KETOROLAC TROMETHAMINE 30 MG/ML IJ SOLN
15.0000 mg | Freq: Four times a day (QID) | INTRAMUSCULAR | Status: DC
  Administered 2015-12-25: 15 mg via INTRAVENOUS
  Filled 2015-12-25: qty 1

## 2015-12-25 MED ORDER — DEXTROSE-NACL 5-0.45 % IV SOLN
INTRAVENOUS | Status: DC
  Administered 2015-12-25: 13:00:00 via INTRAVENOUS

## 2015-12-25 MED ORDER — SODIUM CHLORIDE 0.9 % IV SOLN: 25.0000 mg | INTRAVENOUS | Status: DC | PRN

## 2015-12-25 MED ORDER — POLYETHYLENE GLYCOL 3350 17 G PO PACK: 17.0000 g | PACK | Freq: Every day | ORAL | Status: DC | PRN

## 2015-12-25 MED ORDER — PROMETHAZINE HCL 25 MG PO TABS
12.5000 mg | ORAL_TABLET | ORAL | Status: DC | PRN
  Administered 2015-12-25: 25 mg via ORAL
  Filled 2015-12-25: qty 1

## 2015-12-25 NOTE — H&P (Signed)
[facility] History and Physical  [name] ZOX:[id number] DOB: 02/16/1981 DOA: 12/25/2015  PCP: MATTHEWS,MICHELLE A., MD   Chief Complaint: No chief complaint on file.   HPI: [name] is a 35 y.o. female with history of sickle cell disease. She presents for hydration and pain relief. She has fairly rare pain. She has a partial bottle of oxycodone that was filled in February.  She reports that over the week-end she felt weak and tired She felt better on Monday. She did take 2 does of tylenol during this time. She reports that about 11pm last night, she developed pain in her back and knees that was 10/10.  She did take a dose of oxycodone, which allowed her to sleep.  This morning she was still having pain and feeling dehydrated.  She called to see about coming in for IV fluids.She also agree to pain management with medications.   Systemic Review: General: The patient denies anorexia, fever, weight loss Cardiac: Denies chest pain, syncope, palpitations, pedal edema  Respiratory: Denies cough, shortness of breath, wheezing GI: Denies severe indigestion/heartburn, abdominal pain, nausea, vomiting, diarrhea and constipation GU: Denies hematuria, incontinence, dysuria  Musculoskeletal: Denies arthritis  Skin: Denies suspicious skin lesions Neurologic: Denies focal weakness or numbness, change in vision  Past Medical History:  Diagnosis Date  . Anemia   . Hemoglobin S-S disease (HCC)   . Infertility associated with anovulation   . Jaundice   . Ovarian cyst     Past Surgical History:  Procedure Laterality Date  . CHOLECYSTECTOMY  2001    No Known Allergies  Family History  Problem Relation Age of Onset  . Hypertension Mother   . Hypertension Father   . Cancer Paternal Uncle     stomach  . Cancer Sister     thyroid      Prior to Admission medications   Medication Sig Start Date End Date Taking? Authorizing Provider  cholecalciferol  (VITAMIN D) 1000 units tablet Take 2,000 Units by mouth daily.    Historical Provider, MD  folic acid (FOLVITE) 1 MG tablet Take 4 tablets (4 mg total) by mouth daily. 01/02/15   Lesly DukesKelly H Leggett, MD  letrozole (FEMARA) 2.5 MG tablet TAKE 3 TABLETS (7.5 MG TOTAL) BY MOUTH DAILY DAYS 3-7 OF MENSTRUAL CYCLE. 11/22/15   Lesly DukesKelly H Leggett, MD  Prenatal Vit-Fe Fumarate-FA (PRENATAL MULTIVITAMIN) TABS tablet Take 1 tablet by mouth daily at 12 noon.    Historical Provider, MD     Physical Exam: Vitals:   12/25/15 1155  BP: (P) 122/61  Pulse: (P) 92  Resp: (P) 20  Temp: (P) 98.5 F (36.9 C)  TempSrc: (P) Oral  SpO2: (P) 96%    General: Alert, awake, afebrile, anicteric, not in obvious distress HEENT: Normocephalic and Atraumatic, Mucous membranes pink                PERRLA; EOM intact; No scleral icterus,                 Nares: Patent, Oropharynx: Clear, Fair Dentition                 Neck: FROM, no cervical lymphadenopathy, thyromegaly. CHEST WALL: No tenderness  CHEST: Normal respiration, clear to auscultation bilaterally  HEART: Regular rate and rhythm;  rubs or gallops . There is a 4/6 systolic murmur present.  BACK: No kyphosis or scoliosis; no CVA tenderness  ABDOMEN: Positive Bowel Sounds, soft, non-tender; no masses, no organomegaly EXTREMITIES: No cyanosis,  clubbing, or edema SKIN:  no rash or ulceration  CNS: Alert and Oriented x 4, Nonfocal exam.  Labs on Admission:  Basic Metabolic Panel: No results for input(s): NA, K, CL, CO2, GLUCOSE, BUN, CREATININE, CALCIUM, MG, PHOS in the last 168 hours. Liver Function Tests: No results for input(s): AST, ALT, ALKPHOS, BILITOT, PROT, ALBUMIN in the last 168 hours. No results for input(s): LIPASE, AMYLASE in the last 168 hours. No results for input(s): AMMONIA in the last 168 hours. CBC: No results for input(s): WBC, NEUTROABS, HGB, HCT, MCV, PLT in the last 168 hours. Cardiac Enzymes: No results for input(s): CKTOTAL, CKMB, CKMBINDEX,  TROPONINI in the last 168 hours.  BNP (last 3 results) No results for input(s): BNP in the last 8760 hours.  ProBNP (last 3 results) No results for input(s): PROBNP in the last 8760 hours.  CBG: No results for input(s): GLUCAP in the last 168 hours.   Assessment/Plan Active Problems:   Sickle cell anemia with pain (HCC)   Admits to the Day Hospital  IVF D5 .45% Saline @ 125 mls/hour             Dilaudid 1 mg IV push  IV Toradol 30 mg Q 6 H  Monitor vitals very closely, Re-evaluate pain scale every hour  2 L of Oxygen by [place]  Patient will be re-evaluated for pain in the context of function and relationship to baseline as care progresses.  If no significant relieve from pain (remains above 5/10) will transfer patient to inpatient services for further evaluation and management  Code Status: Full  Family Communication: None  DVT Prophylaxis: Ambulate as tolerated   Time spent: 35 Minutes  BERNHARDT, LINDA  If 7PM-7AM, please contact night-coverage [email or web address] 12/25/2015, 12:39 PM   Evaluation and management procedures were performed by the Advanced Practitioner under my supervision and collaboration. I have reviewed the Advanced Practitioner's note and chart, and I agree with the management and plan.   Jeanann Lewandowsky, MD, MHA, CPE, FACP, FAAP [facility] [employer], [place] [phone]   12/26/2015, 3:31 PM

## 2015-12-25 NOTE — Telephone Encounter (Signed)
Notified L. Bernhardt NP about patient's complaints of pain. Advised to contact provider that prescribes the oxycodone for direction. Patient notified and patient says that either Dr. Ashley RoyaltyMatthews or Erin SonsLaChina prescribed that oxycodone and she [facility]2 years. States I just don't take Oxycodone... So Bonita QuinLinda NP advises for patient to [facility] and I have asked the patient to bring her pain medicine bottle and she agrees and will come.

## 2015-12-25 NOTE — Telephone Encounter (Addendum)
Called in complaining of "waist joint pain" x 3 days. Trying to hydrate, but feels that she needs IV fluids. Took extra strength Tylenol yesterday and stayed out of work. Took oxycodone at 11 p last night without relief....8/10 pain. Has not taken any pain medicine this morning but stayed out of work due to continued pain. Denies fever, chest pain, N/V, diarrhea or abdominal pain. I let patient know that I will contact the provider and call her back.

## 2015-12-25 NOTE — Discharge Summary (Signed)
PCP: MATTHEWS,MICHELLE A., MD  Admit date: 12/25/2015 Discharge date: 12/25/2015  Time spent: 35 min   Discharge Diagnoses:  Active Problems:   Sickle cell anemia with pain [facility](HCC)   Discharge Condition: Stable Diet recommendation: Regular  There were no vitals filed for this visit.  History of present illness:   [name] is a 35 y.o. female with history of sickle cell disease. She presents for hydration and pain relief. She has fairly rare pain. She has a partial bottle of oxycodone that was filled in February.  She reports that over the week-end she felt weak and tired She felt better on Monday. She did take 2 does of tylenol during this time. She reports that about 11pm last night, she developed pain in her back and knees that was 10/10.  She did take a dose of oxycodone, which allowed her to sleep.  This morning she was still having pain and feeling dehydrated.  She called to see about coming in for IV fluids.She also agree to pain management with medications.     Hospital Course:  Patient was admitted to the day hospital with sickle cell painful crisis. She was treated 1 mg of Dilaudid IV push, Toradol as well as IV fluids. She improved symptomatically and her pain went down from 8 out of 10 down to 2 out of 10 She did require antiemetic for nausea and vomiting, most likely related to the narcotic.  She is to follow-up in the clinic as previously scheduled. And she is to continue with her home medications as per prior to admission.She received approx. 700 cc of D5 1/2 NS.  Discharge Exam: Vitals:   12/25/15 1155  BP: 122/61  Pulse: 92  Resp: 20  Temp: 98.5 F (36.9 C)    General appearance: alert, cooperative and no distress Eyes: conjunctivae/corneas clear. PERRL, EOM's intact.  Neck: no adenopathy,  supple, symmetrical, trachea midline and thyroid not enlarged, symmetric, no tenderness/mass/nodules Back: symmetric, no curvature. ROM normal. No CVA  tenderness. Resp: clear to auscultation bilaterally Chest wall: no tenderness Cardio: regular rate and rhythm, S1, S2 normal, click, rub or gallop. Systolic murmur present GI: soft, non-tender; bowel sounds normal; no masses, no organomegaly Extremities: extremities normal, atraumatic, no cyanosis or edema Pulses: 2+ and symmetric Skin: Skin color, texture, turgor normal. No rashes or lesions Neurologic: Grossly normal  Discharge Instructions Home to continue current medications Follow-up as needed    Current Discharge Medication List    CONTINUE these medications which have NOT CHANGED   Details  cholecalciferol (VITAMIN D) 1000 units tablet Take 2,000 Units by mouth daily.    folic acid (FOLVITE) 1 MG tablet Take 4 tablets (4 mg total) by mouth daily. Qty: 120 tablet, Refills: 4   Associated Diagnoses: Hb-SS disease without crisis (HCC)    levothyroxine (SYNTHROID, LEVOTHROID) 125 MCG tablet Take 125 mcg by mouth daily before breakfast.    letrozole (FEMARA) 2.5 MG tablet TAKE 3 TABLETS (7.5 MG TOTAL) BY MOUTH DAILY DAYS 3-7 OF MENSTRUAL CYCLE. Qty: 15 tablet, Refills: 1   Associated Diagnoses: Anovulatory    Prenatal Vit-Fe Fumarate-FA (PRENATAL MULTIVITAMIN) TABS tablet Take 1 tablet by mouth daily at 12 noon.       No Known Allergies    The results of significant diagnostics from this hospitalization (including imaging, microbiology, ancillary and laboratory) are listed below for reference.    Significant Diagnostic Studies: No results found.  Microbiology: No results found for this or any previous visit (from  the past 240 hour(s)).   Labs: Basic Metabolic Panel: No results for input(s): NA, K, CL, CO2, GLUCOSE, BUN, CREATININE, CALCIUM, MG, PHOS in the last 168 hours. Liver Function Tests: No results for input(s): AST, ALT, ALKPHOS, BILITOT, PROT, ALBUMIN in the last 168 hours. No results for input(s): LIPASE, AMYLASE in the last 168 hours. No results for  input(s): AMMONIA in the last 168 hours. CBC: No results for input(s): WBC, NEUTROABS, HGB, HCT, MCV, PLT in the last 168 hours. Cardiac Enzymes: No results for input(s): CKTOTAL, CKMB, CKMBINDEX, TROPONINI in the last 168 hours. BNP: BNP (last 3 results) No results for input(s): BNP in the last 8760 hours.  ProBNP (last 3 results) No results for input(s): PROBNP in the last 8760 hours.  CBG: No results for input(s): GLUCAP in the last 168 hours.     Signed:  Concepcion Living MSN,FNP,BC

## 2015-12-25 NOTE — Progress Notes (Signed)
Vomited large amount emesis. Patient says she feels much better and the pain in abdomen is relieved.

## 2015-12-25 NOTE — Discharge Instructions (Signed)
Sickle Cell Anemia, Adult  Resume activity gradually Resume home medications   Sickle cell anemia is a condition where your red blood cells are shaped like sickles. Red blood cells carry oxygen through the body. Sickle-shaped red blood cells do not live as long as normal red blood cells. They also clump together and block blood from flowing through the blood vessels. These things prevent the body from getting enough oxygen. Sickle cell anemia causes organ damage and pain. It also increases the risk of infection. HOME CARE  Drink enough fluid to keep your pee (urine) clear or pale yellow. Drink more in hot weather and during exercise.  Do not smoke. Smoking lowers oxygen levels in the blood.  Only take over-the-counter or prescription medicines as told by your doctor.  Take antibiotic medicines as told by your doctor. Make sure you finish them even if you start to feel better.  Take supplements as told by your doctor.  Consider wearing a medical alert bracelet. This tells anyone caring for you in an emergency of your condition.  When traveling, keep your medical information, doctors' names, and the medicines you take with you at all times.  If you have a fever, do not take fever medicines right away. This could cover up a problem. Tell your doctor.   Keep all follow-up visits with your doctor. Sickle cell anemia requires regular medical care. GET HELP IF: You have a fever. GET HELP RIGHT AWAY IF:  You feel dizzy or faint.  You have new belly (abdominal) pain, especially on the left side near the stomach area.  You have a lasting, often uncomfortable and painful erection of the penis (priapism). If it is not treated right away, you will become unable to have sex (impotence).  You have numbness in your arms or legs or you have a hard time moving them.  You have a hard time talking.  You have a fever or lasting symptoms for more than 2-3 days.  You have a fever and your symptoms  suddenly get worse.  You have signs or symptoms of infection. These include:  Chills.  Being more tired than normal (lethargy).  Irritability.  Poor eating.  Throwing up (vomiting).  You have pain that is not helped with medicine.  You have shortness of breath.  You have pain in your chest.  You are coughing up pus-like or bloody mucus.  You have a stiff neck.  Your feet or hands swell or have pain.  Your belly looks bloated.  Your joints hurt. MAKE SURE YOU:  Understand these instructions.  Will watch your condition.  Will get help right away if you are not doing well or get worse.   This information is not intended to replace advice given to you by your health care provider. Make sure you discuss any questions you have with your health care provider.   Document Released: 01/12/2013 Document Revised: 08/08/2014 Document Reviewed: 01/12/2013 Elsevier Interactive Patient Education [employer]2016 Elsevier Inc.

## 2015-12-25 NOTE — Progress Notes (Signed)
[facility] of pain 5/10 at hip level, joint pain. Treated with IV fluid, IV Toradol and one IV push of Dilaudid 1mg . Pain level down to 0/10 at time of discharge. Received approx. 700 ml IV fluids and slept during her stay. Went over discharge instructions and copy given to patient. Alert, oriented and ambulatory at time of discharge. Discharged to home with a ride.

## 2016-01-16 ENCOUNTER — Telehealth: Payer: Self-pay

## 2016-01-16 ENCOUNTER — Other Ambulatory Visit: Payer: Self-pay

## 2016-01-16 DIAGNOSIS — D571 Sickle-cell disease without crisis: Secondary | ICD-10-CM

## 2016-01-16 MED ORDER — FOLIC ACID 1 MG PO TABS: 4.0000 mg | ORAL_TABLET | Freq: Every day | ORAL | 4 refills | Status: DC

## 2016-01-16 NOTE — Telephone Encounter (Signed)
This was sent into pharmacy. Thanks!

## 2016-01-16 NOTE — Telephone Encounter (Signed)
Refill for folic acid sent into pharmacy. Thanks!  

## 2016-01-17 ENCOUNTER — Ambulatory Visit: Payer: Federal, State, Local not specified - PPO | Admitting: Family Medicine

## 2016-02-01 ENCOUNTER — Encounter: Payer: Self-pay | Admitting: Obstetrics & Gynecology

## 2016-02-12 ENCOUNTER — Other Ambulatory Visit: Payer: Self-pay | Admitting: *Deleted

## 2016-02-12 DIAGNOSIS — Z87898 Personal history of other specified conditions: Secondary | ICD-10-CM

## 2016-02-12 NOTE — Progress Notes (Signed)
Spoke with patient concerning her screening mammogram that was recommended after her last one in 2015.  She stated that she was not aware that she needed another until 3-5 years.  Another order was placed in Epic and pt is going to call and schjedule appt.

## 2016-02-20 ENCOUNTER — Ambulatory Visit (INDEPENDENT_AMBULATORY_CARE_PROVIDER_SITE_OTHER): Payer: Federal, State, Local not specified - PPO

## 2016-02-20 DIAGNOSIS — Z1231 Encounter for screening mammogram for malignant neoplasm of breast: Secondary | ICD-10-CM | POA: Diagnosis not present

## 2016-02-20 DIAGNOSIS — Z87898 Personal history of other specified conditions: Secondary | ICD-10-CM

## 2016-02-21 ENCOUNTER — Other Ambulatory Visit: Payer: Self-pay | Admitting: Obstetrics & Gynecology

## 2016-02-21 DIAGNOSIS — N970 Female infertility associated with anovulation: Secondary | ICD-10-CM

## 2016-06-18 ENCOUNTER — Other Ambulatory Visit: Payer: Self-pay | Admitting: Family Medicine

## 2016-06-18 DIAGNOSIS — D571 Sickle-cell disease without crisis: Secondary | ICD-10-CM

## 2016-09-08 ENCOUNTER — Telehealth: Payer: Self-pay | Admitting: Endocrinology

## 2016-09-08 NOTE — Telephone Encounter (Signed)
LM for pt to returncall

## 2016-09-08 NOTE — Telephone Encounter (Signed)
Patient called to get a new patient appointment with Dr. Everardo AllEllison, I advised that Endo only goes by referrals however, patient was told by insurance she could just call. Please advise, patient is awaiting a phone call.

## 2016-09-08 NOTE — Telephone Encounter (Signed)
Please advise. Thank you

## 2016-09-15 NOTE — Telephone Encounter (Signed)
LM for pt to returncall

## 2016-09-25 NOTE — Telephone Encounter (Signed)
LM for pt to call back to schedule °

## 2016-10-21 ENCOUNTER — Other Ambulatory Visit: Payer: Self-pay | Admitting: Family Medicine

## 2016-10-21 DIAGNOSIS — D571 Sickle-cell disease without crisis: Secondary | ICD-10-CM

## 2016-11-26 ENCOUNTER — Other Ambulatory Visit: Payer: Self-pay | Admitting: Family Medicine

## 2017-02-23 ENCOUNTER — Encounter: Payer: Self-pay | Admitting: Obstetrics & Gynecology

## 2017-02-23 ENCOUNTER — Ambulatory Visit (INDEPENDENT_AMBULATORY_CARE_PROVIDER_SITE_OTHER): Payer: Federal, State, Local not specified - PPO | Admitting: Obstetrics & Gynecology

## 2017-02-23 VITALS — BP 120/72 | HR 91 | Wt 155.0 lb

## 2017-02-23 DIAGNOSIS — D571 Sickle-cell disease without crisis: Secondary | ICD-10-CM | POA: Diagnosis not present

## 2017-02-23 DIAGNOSIS — Z124 Encounter for screening for malignant neoplasm of cervix: Secondary | ICD-10-CM | POA: Diagnosis not present

## 2017-02-23 DIAGNOSIS — Z1151 Encounter for screening for human papillomavirus (HPV): Secondary | ICD-10-CM | POA: Diagnosis not present

## 2017-02-23 DIAGNOSIS — Z3481 Encounter for supervision of other normal pregnancy, first trimester: Secondary | ICD-10-CM

## 2017-02-23 DIAGNOSIS — O99011 Anemia complicating pregnancy, first trimester: Secondary | ICD-10-CM | POA: Diagnosis not present

## 2017-02-23 DIAGNOSIS — Z113 Encounter for screening for infections with a predominantly sexual mode of transmission: Secondary | ICD-10-CM

## 2017-02-23 DIAGNOSIS — Z3490 Encounter for supervision of normal pregnancy, unspecified, unspecified trimester: Secondary | ICD-10-CM

## 2017-02-23 DIAGNOSIS — O0990 Supervision of high risk pregnancy, unspecified, unspecified trimester: Secondary | ICD-10-CM

## 2017-02-23 MED ORDER — FOLIC ACID 1 MG PO TABS: 4.0000 mg | ORAL_TABLET | Freq: Every day | ORAL | 6 refills | Status: DC

## 2017-02-23 NOTE — Progress Notes (Signed)
Bedside U/S showed IUP with FHT of 147 BPM and CRL is 13.1 mm.  TSH on 02/16/17 = 2.375

## 2017-02-24 LAB — ANTIBODY ID, TITER, AND TYPING, RBC
Antigen Typing (2): NEGATIVE
Antigen Typing (3): NEGATIVE
Titer: 1:1 {titer}
Titer: 1:2 {titer}

## 2017-02-24 LAB — PROTEIN / CREATININE RATIO, URINE
CREATININE, URINE: 81 mg/dL (ref 20–275)
PROTEIN/CREAT RATIO: 99 mg/g{creat} (ref 21–161)
TOTAL PROTEIN, URINE: 8 mg/dL (ref 5–24)

## 2017-02-24 LAB — OBSTETRIC PANEL
ABSOLUTE NUCLEATED RBC: 1950 cells/uL — ABNORMAL HIGH
Antibody Screen: POSITIVE — AB
BASOS ABS: 62 {cells}/uL (ref 0–200)
BASOS PCT: 0.8 %
Eosinophils Absolute: 78 cells/uL (ref 15–500)
Eosinophils Relative: 1 %
HCT: 22.5 % — ABNORMAL LOW (ref 35.0–45.0)
HEMOGLOBIN: 7.7 g/dL — AB (ref 11.7–15.5)
HEP B S AG: NONREACTIVE
Lymphs Abs: 2714 cells/uL (ref 850–3900)
MCH: 32.6 pg (ref 27.0–33.0)
MCHC: 34.2 g/dL (ref 32.0–36.0)
MCV: 95.3 fL (ref 80.0–100.0)
MONOS PCT: 8.4 %
MPV: 10.3 fL (ref 7.5–12.5)
NEUTROS ABS: 4290 {cells}/uL (ref 1500–7800)
NEUTROS PCT: 55 %
NUCLEATED RBC: 25 /100 WBC — ABNORMAL HIGH
Platelets: 349 10*3/uL (ref 140–400)
RBC: 2.36 10*6/uL — ABNORMAL LOW (ref 3.80–5.10)
RDW: 23.7 % — ABNORMAL HIGH (ref 11.0–15.0)
RPR Ser Ql: NONREACTIVE
RUBELLA: 5.25 {index}
TOTAL LYMPHOCYTE: 34.8 %
WBC mixed population: 655 cells/uL (ref 200–950)
WBC: 7.8 10*3/uL (ref 3.8–10.8)

## 2017-02-24 LAB — CBC MORPHOLOGY

## 2017-02-24 LAB — HIV ANTIBODY (ROUTINE TESTING W REFLEX): HIV: NONREACTIVE

## 2017-02-24 NOTE — Progress Notes (Signed)
Subjective:    [name] is a G2P0010 [id number] being seen today for her first obstetrical visit.  Her obstetrical history is significant for sickle cell disease with history of severe delayed transfusion reaction, history of one miscarriage. Patient does intend to breast feed. Pregnancy history fully reviewed.  Patient reports sore breasts.  Vitals:   02/23/17 1436  BP: 120/72  Pulse: 91  Weight: 155 lb (70.3 kg)    HISTORY: OB History  Gravida Para Term Preterm AB Living  2 0 0 0 1 0  SAB TAB Ectopic Multiple Live Births  1 0 0 0      # Outcome Date GA Lbr Len/2nd Weight Sex Delivery Anes PTL Lv  2 Current           1 SAB              Past Medical History:  Diagnosis Date  . Anemia   . Hemoglobin S-S disease (HCC)   . Infertility associated with anovulation   . Jaundice   . Ovarian cyst   . Sickle cell disease (HCC)    Past Surgical History:  Procedure Laterality Date  . CHOLECYSTECTOMY  2001   Family History  Problem Relation Age of Onset  . Hypertension Mother   . Hypertension Father   . Cancer Paternal Uncle        stomach  . Cancer Sister        thyroid     Exam    Uterus:     Pelvic Exam:    Perineum: No Hemorrhoids   Vulva: normal   Vagina:  normal mucosa, normal discharge   pH: n/a   Cervix: no cervical motion tenderness and no lesions   Adnexa: normal adnexa and no mass, fullness, tenderness   Bony Pelvis: average  System: Breast:  normal appearance, no masses or tenderness, Inspection negative   Skin: normal coloration and turgor, no rashes    Neurologic: oriented, normal mood   Extremities: no deformities   HEENT extra ocular movement intact, neck supple with midline trachea, thyroid without masses and trachea midline   Mouth/Teeth mucous membranes moist, pharynx normal without lesions and dental hygiene poor   Neck supple and no masses   Cardiovascular: regular rate and rhythm   Respiratory:  appears well, vitals normal, no  respiratory distress, acyanotic, normal RR, neck free of mass or lymphadenopathy, chest clear, no wheezing, crepitations, rhonchi, normal symmetric air entry   Abdomen: soft, non-tender; bowel sounds normal; no masses,  no organomegaly   Urinary: urethral meatus normal      Assessment:    Pregnancy: G2P0010 Patient Active Problem List   Diagnosis Date Noted  . Sickle cell anemia with pain (HCC) 12/25/2015  . Left knee pain 12/15/2014  . Infertility, female 03/24/2014  . Palpitations 02/16/2014  . Dyspnea 02/13/2014  . Other fatigue 02/09/2014  . Vitamin D insufficiency 02/09/2014  . Cyst of right breast 08/19/2013  . 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 Sickle cell anemia (HCC) 05/27/2012  . 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 Thromboembolism of vein 03/17/2012        Plan:   Problem list reviewed and updated. Initial labs drawn.  Sicle Cell Disease 1.  Prenatal vitamins + 4mg  folic acid 2.  [facility] and [facility]; [facility].  MFM requested I start Lovenox now and pt will remain on 40 mg SQ throughout pregnancy 3.  Baseline urine culture and q trimester 4.  Baseline protine creatinine ratio and CMP; serial CBC, CMP through pregnancy 5.  ASA at 13 weeks for Pre E prevention 6  Growth US 24,28,32,26 weeks 7.  Will wait for further recommendations from MFM  Genetics: 1.  Pt getting records from [facility] to see if prenatal genetic testing was completed ([facility], [facility]) 2.  Offer Panorama testing 3.  Well dated.   Follow up in 2 weeks. 2 hour GTT for Diabetes screening Check FH--Pt anxious due to last miscarriage.  Elsie LincolnKelly Laurynn Mccorvey 02/24/2017

## 2017-02-25 DIAGNOSIS — O0990 Supervision of high risk pregnancy, unspecified, unspecified trimester: Secondary | ICD-10-CM | POA: Insufficient documentation

## 2017-02-25 LAB — CULTURE, URINE COMPREHENSIVE
MICRO NUMBER: 81303308
RESULT:: NO GROWTH
SPECIMEN QUALITY: ADEQUATE

## 2017-02-25 LAB — URINE CYTOLOGY ANCILLARY ONLY
Chlamydia: NEGATIVE
Neisseria Gonorrhea: NEGATIVE

## 2017-03-01 ENCOUNTER — Encounter: Payer: Self-pay | Admitting: Obstetrics & Gynecology

## 2017-03-02 ENCOUNTER — Other Ambulatory Visit: Payer: Federal, State, Local not specified - PPO

## 2017-03-02 LAB — CYTOLOGY - PAP
Diagnosis: NEGATIVE
HPV: NOT DETECTED

## 2017-03-04 ENCOUNTER — Telehealth: Payer: Self-pay | Admitting: *Deleted

## 2017-03-04 MED ORDER — DOXYLAMINE-PYRIDOXINE 10-10 MG PO TBEC: 2.0000 | DELAYED_RELEASE_TABLET | Freq: Every day | ORAL | 5 refills | Status: DC

## 2017-03-04 NOTE — Telephone Encounter (Signed)
Pt called requesting something for nausea in pregnancy.  Per protocol may have Diclegis.  This was sent to [facility] [address]

## 2017-03-09 ENCOUNTER — Ambulatory Visit (INDEPENDENT_AMBULATORY_CARE_PROVIDER_SITE_OTHER): Payer: Federal, State, Local not specified - PPO | Admitting: Advanced Practice Midwife

## 2017-03-09 ENCOUNTER — Ambulatory Visit (INDEPENDENT_AMBULATORY_CARE_PROVIDER_SITE_OTHER): Payer: Federal, State, Local not specified - PPO

## 2017-03-09 VITALS — BP 111/64 | HR 88 | Wt 152.0 lb

## 2017-03-09 DIAGNOSIS — O021 Missed abortion: Secondary | ICD-10-CM | POA: Diagnosis not present

## 2017-03-09 DIAGNOSIS — IMO0002 Reserved for concepts with insufficient information to code with codable children: Secondary | ICD-10-CM

## 2017-03-09 DIAGNOSIS — O0990 Supervision of high risk pregnancy, unspecified, unspecified trimester: Secondary | ICD-10-CM

## 2017-03-09 NOTE — Patient Instructions (Signed)

## 2017-03-09 NOTE — Progress Notes (Signed)
FHR not seen on BS US. CRL 9 weeks. Sent for Stat formal US. Denies abd pain, VB.  Katrinka BlazingSmith, [place], CNM 03/09/2017 9:37 AM

## 2017-03-09 NOTE — Progress Notes (Signed)
Subjective:     Patient ID: [name], female   DOB: 11/01/1980, 36 y.o.   MRN: [id number]  HPI: Here for ROB. FHR not seen on informal BS US. Formal US confirmed missed AB. Baby measuring 9.3 weeks. No cramping or bleeding.   Review of Systems  Constitutional: Negative for chills and fever.  Gastrointestinal: Negative for abdominal pain.  Genitourinary: Negative for vaginal bleeding.       Objective:BP 111/64   Pulse 88   Wt 152 lb (68.9 kg)   LMP 12/29/2016 (Exact Date)   BMI 28.72 kg/m     Physical Exam  Constitutional: She is oriented to person, place, and time. She appears well-developed and well-nourished. No distress.  Cardiovascular: Normal rate.  Pulmonary/Chest: Effort normal.  Genitourinary:  Genitourinary Comments: Deferred. No VB on pelvic US.   Neurological: She is alert and oriented to person, place, and time.  Skin: Skin is warm and dry.  Psychiatric: She has a normal mood and affect.  Nursing note and vitals reviewed.      Assessment/Plan:    1. Supervision of high risk pregnancy, antepartum   2. Fetal heartbeat not heard  - US OB Transvaginal; Future - US OB Comp Less 14 Wks; Future  3. Missed abortion with fetal demise before 20 completd weeks of gestation Discussed options for management of incomplete AB including expectant management, Cytotec or D&C. Recommend D7C due to low hgb and positive antibody screen. Pt agrees will [facility]. Instructed to come to MAU for emergencies R/T SAB.   Katrinka BlazingSmith, [place], CNM 03/09/2017 10:56 AM

## 2017-04-15 ENCOUNTER — Encounter: Payer: Self-pay | Admitting: Obstetrics and Gynecology

## 2017-04-15 ENCOUNTER — Ambulatory Visit (INDEPENDENT_AMBULATORY_CARE_PROVIDER_SITE_OTHER): Payer: Federal, State, Local not specified - PPO | Admitting: Obstetrics and Gynecology

## 2017-04-15 VITALS — BP 105/68 | HR 91 | Resp 16 | Ht 61.0 in | Wt 151.0 lb

## 2017-04-15 DIAGNOSIS — Z9889 Other specified postprocedural states: Secondary | ICD-10-CM

## 2017-04-15 NOTE — Progress Notes (Signed)
37 yo G2P0020 here for [post op check. Patient underwent a D&C on 03/19/2017 for the management of a missed abortion. Patient reports feeling well. She denies any cramping or vaginal bleeding. She is in a better place emotionally as well. She plans to try again naturally in March and is saving up for IVF with donor egg. Patient is currently without any complaints. She has not resumed her menses yet.  Past Medical History:  Diagnosis Date  . Anemia   . Hemoglobin S-S disease (HCC)   . Infertility associated with anovulation   . Jaundice   . Ovarian cyst   . Sickle cell disease (HCC)    Past Surgical History:  Procedure Laterality Date  . CHOLECYSTECTOMY  2001   Family History  Problem Relation Age of Onset  . Hypertension Mother   . Hypertension Father   . Cancer Paternal Uncle        stomach  . Cancer Sister        thyroid   Social History   Tobacco Use  . Smoking status: Never Smoker  . Smokeless tobacco: Never Used  Substance Use Topics  . Alcohol use: No    Alcohol/week: 0.0 oz  . Drug use: No   ROS See pertinent in HPI  Blood pressure 105/68, pulse 91, resp. rate 16, height 5\' 1"  (1.549 m), weight 151 lb (68.5 kg), last menstrual period 12/29/2016, unknown if currently breastfeeding. GENERAL: Well-developed, well-nourished female in no acute distress.  NEURO: alert and oriented x 3  A/P 37 yo s/p D&C for missed abortion - Reviewed pathology report - Patient is medically cleared to resume all activities of daily living - Advised patient to continue prenatal vitamins - patient with normal pap smear 02/2017 - RTC prn

## 2017-06-22 ENCOUNTER — Other Ambulatory Visit: Payer: Self-pay | Admitting: Advanced Practice Midwife

## 2017-10-27 ENCOUNTER — Other Ambulatory Visit: Payer: Self-pay | Admitting: Family Medicine

## 2017-10-27 ENCOUNTER — Ambulatory Visit (HOSPITAL_COMMUNITY)
Admission: RE | Admit: 2017-10-27 | Discharge: 2017-10-27 | Disposition: A | Discharge status: Home / Self Care | Payer: Federal, State, Local not specified - PPO | Source: Ambulatory Visit | Attending: Family Medicine | Admitting: Family Medicine

## 2017-10-27 DIAGNOSIS — D5700 Hb-SS disease with crisis, unspecified: Secondary | ICD-10-CM

## 2017-10-27 DIAGNOSIS — D571 Sickle-cell disease without crisis: Secondary | ICD-10-CM

## 2017-10-27 DIAGNOSIS — E860 Dehydration: Secondary | ICD-10-CM

## 2017-10-27 MED ORDER — SODIUM CHLORIDE 0.45 % IV BOLUS: 250.0000 mL | Freq: Once | INTRAVENOUS | Status: DC

## 2017-10-27 MED ORDER — SODIUM CHLORIDE 0.45 % IV BOLUS
250.0000 mL | Freq: Once | INTRAVENOUS | Status: AC
  Administered 2017-10-27: 250 mL via INTRAVENOUS

## 2017-10-27 MED ORDER — DIPHENHYDRAMINE HCL 25 MG PO CAPS
25.0000 mg | ORAL_CAPSULE | Freq: Once | ORAL | Status: AC
  Administered 2017-10-27: 25 mg via ORAL
  Filled 2017-10-27: qty 1

## 2017-10-27 MED ORDER — HYDROMORPHONE HCL 1 MG/ML IJ SOLN
0.5000 mg | Freq: Once | INTRAMUSCULAR | Status: AC
  Administered 2017-10-27: 0.5 mg via INTRAVENOUS
  Filled 2017-10-27: qty 1

## 2017-10-27 MED ORDER — ONDANSETRON HCL 4 MG/2ML IJ SOLN
4.0000 mg | Freq: Once | INTRAMUSCULAR | Status: AC
  Administered 2017-10-27: 4 mg via INTRAVENOUS
  Filled 2017-10-27: qty 2

## 2017-10-27 NOTE — Progress Notes (Unsigned)
Patient ID: [name], female   DOB: 04/05/1981, 37 y.o.   MRN: [id number]

## 2017-10-27 NOTE — Discharge Instructions (Signed)
Dehydration, Adult Dehydration is when there is not enough fluid or water in your body. This happens when you lose more fluids than you take in. Dehydration can range from mild to very bad. It should be treated right away to keep it from getting very bad. Symptoms of mild dehydration may include:  Thirst.  Dry lips.  Slightly dry mouth.  Dry, warm skin.  Dizziness. Symptoms of moderate dehydration may include:  Very dry mouth.  Muscle cramps.  Dark pee (urine). Pee may be the color of tea.  Your body making less pee.  Your eyes making fewer tears.  Heartbeat that is uneven or faster than normal (palpitations).  Headache.  Light-headedness, especially when you stand up from sitting.  Fainting (syncope). Symptoms of very bad dehydration may include:  Changes in skin, such as: ? Cold and clammy skin. ? Blotchy (mottled) or pale skin. ? Skin that does not quickly return to normal after being lightly pinched and let go (poor skin turgor).  Changes in body fluids, such as: ? Feeling very thirsty. ? Your eyes making fewer tears. ? Not sweating when body temperature is high, such as in hot weather. ? Your body making very little pee.  Changes in vital signs, such as: ? Weak pulse. ? Pulse that is more than 100 beats a minute when you are sitting still. ? Fast breathing. ? Low blood pressure.  Other changes, such as: ? Sunken eyes. ? Cold hands and feet. ? Confusion. ? Lack of energy (lethargy). ? Trouble waking up from sleep. ? Short-term weight loss. ? Unconsciousness. Follow these instructions at home:  If told by your doctor, drink an ORS: ? Make an ORS by using instructions on the package. ? Start by drinking small amounts, about  cup (120 mL) every 5-10 minutes. ? Slowly drink more until you have had the amount that your doctor said to have.  Drink enough clear fluid to keep your pee clear or pale yellow. If you were told to drink an ORS, finish the ORS  first, then start slowly drinking clear fluids. Drink fluids such as: ? Water. Do not drink only water by itself. Doing that can make the salt (sodium) level in your body get too low (hyponatremia). ? Ice chips. ? Fruit juice that you have added water to (diluted). ? Low-calorie sports drinks.  Avoid: ? Alcohol. ? Drinks that have a lot of sugar. These include high-calorie sports drinks, fruit juice that does not have water added, and soda. ? Caffeine. ? Foods that are greasy or have a lot of fat or sugar.  Take over-the-counter and prescription medicines only as told by your doctor.  Do not take salt tablets. Doing that can make the salt level in your body get too high (hypernatremia).  Eat foods that have minerals (electrolytes). Examples include bananas, oranges, potatoes, tomatoes, and spinach.  Keep all follow-up visits as told by your doctor. This is important. Contact a doctor if:  You have belly (abdominal) pain that: ? Gets worse. ? Stays in one area (localizes).  You have a rash.  You have a stiff neck.  You get angry or annoyed more easily than normal (irritability).  You are more sleepy than normal.  You have a harder time waking up than normal.  You feel: ? Weak. ? Dizzy. ? Very thirsty.  You have peed (urinated) only a small amount of very dark pee during 6-8 hours. Get help right away if:  You have symptoms of   very bad dehydration.  You cannot drink fluids without throwing up (vomiting).  Your symptoms get worse with treatment.  You have a fever.  You have a very bad headache.  You are throwing up or having watery poop (diarrhea) and it: ? Gets worse. ? Does not go away.  You have blood or something green (bile) in your throw-up.  You have blood in your poop (stool). This may cause poop to look black and tarry.  You have not peed in 6-8 hours.  You pass out (faint).  Your heart rate when you are sitting still is more than 100 beats a  minute.  You have trouble breathing. This information is not intended to replace advice given to you by your health care provider. Make sure you discuss any questions you have with your health care provider. Document Released: 01/18/2009 Document Revised: 10/12/2015 Document Reviewed: 05/18/2015 Elsevier Interactive Patient Education  2018 [employer]  

## 2017-10-27 NOTE — Progress Notes (Signed)
Leata Mouserene Acheampong-Asare, a 37 year old female with a history of sickle cell anemia presents for hydration. Patient will need to schedule a first available appointment in primary care.   Nolon NationsLachina Moore Seraphine Gudiel  APRN, MSN, FNP-C [facility] [address]  [place], [place] [id number] [phone]

## 2017-10-27 NOTE — Progress Notes (Signed)
Patient came to day hospital for hydration and management of sickle cell pain. Patient given 500 cc bolus of 0.45% Normal Saline and 0.5 mg of Dilaudid IV.  Patient initially reported back pain rated 8/10. At discharge, patient's pain level at 1/10. Discharge instructions given. Vital signs stable. Patient alert, oriented and ambulatory at discharge.

## 2017-11-05 ENCOUNTER — Other Ambulatory Visit: Payer: Self-pay | Admitting: Obstetrics & Gynecology

## 2017-11-05 DIAGNOSIS — D571 Sickle-cell disease without crisis: Secondary | ICD-10-CM

## 2017-11-17 ENCOUNTER — Encounter: Payer: Self-pay | Admitting: Obstetrics & Gynecology

## 2017-11-17 ENCOUNTER — Ambulatory Visit (INDEPENDENT_AMBULATORY_CARE_PROVIDER_SITE_OTHER): Payer: Federal, State, Local not specified - PPO | Admitting: Obstetrics & Gynecology

## 2017-11-17 VITALS — BP 102/67 | HR 90 | Resp 16 | Ht 61.0 in | Wt 150.0 lb

## 2017-11-17 DIAGNOSIS — Z01419 Encounter for gynecological examination (general) (routine) without abnormal findings: Secondary | ICD-10-CM

## 2017-11-17 DIAGNOSIS — R102 Pelvic and perineal pain: Secondary | ICD-10-CM

## 2017-11-17 NOTE — Progress Notes (Signed)
Subjective:     [name] is a [age] y.o. female here for a routine exam.  Current complaints: pain with intercourse during ovulation..  Pain can be in buttocks and across the front lower abdomen.  It is not present when she is at other points of cycle.     Gynecologic History Patient's last menstrual period was 10/19/2017. Contraception: none Last Pap: 02/2017. Results were: normal Last mammogram: approx 2 years ago. Results were: normal  Obstetric History OB History  Gravida Para Term Preterm AB Living  2 0 0 0 2 0  SAB TAB Ectopic Multiple Live Births  2 0 0 0      # Outcome Date GA Lbr Len/2nd Weight Sex Delivery Anes PTL Lv  2 SAB           1 SAB              The following portions of the patient's history were reviewed and updated as appropriate: allergies, current medications, past family history, past medical history, past social history, past surgical history and problem list.  Review of Systems Pertinent items noted in HPI and remainder of comprehensive ROS otherwise negative.    Objective:      Vitals:   11/17/17 1304  BP: 102/67  Pulse: 90  Resp: 16  Weight: 150 lb (68 kg)  Height: 5\' 1"  (1.549 m)   Vitals:  WNL General appearance: alert, cooperative and no distress  HEENT: Normocephalic, without obvious abnormality, atraumatic Eyes: negative Throat: lips, mucosa, and tongue normal; teeth and gums normal  Respiratory: Clear to auscultation bilaterally  CV: Regular rate and rhythm  Breasts:  Normal appearance, no masses or tenderness, no nipple retraction or dimpling  GI: Soft, non-tender; bowel sounds normal; no masses,  no organomegaly  GU: External Genitalia:  Tanner V, no lesion Urethra:  No prolapse   Vagina: Pink, normal rugae, no blood or discharge, pain along vaginal walls and posteriorly towards rectum  Cervix: No CMT, no lesion  Uterus:  Normal size and contour, tender to moderate palpation  Adnexa: Normal, no masses, diffusely  tender  Musculoskeletal: No edema, redness or tenderness in the calves or thighs; no back pain; negative straight leg raise  Skin: No lesions or rash  Lymphatic: Axillary adenopathy: none     Psychiatric: Normal mood and behavior        Assessment:    Healthy female exam.   Pelvic pain Pelvic floor pain   Plan:   Pap due 2021 earliest Pelvic PT referral Pt seeing [facility] in [place] very soon, will let them do US as they usually like to evaluate their own patients.  If an US is not done, we will order one.   Ibuprofen for dysmenorrhea

## 2019-11-30 ENCOUNTER — Ambulatory Visit: Payer: Federal, State, Local not specified - PPO | Admitting: Orthopaedic Surgery

## 2020-03-28 ENCOUNTER — Emergency Department
Admission: RE | Admit: 2020-03-28 | Discharge: 2020-03-28 | Disposition: A | Discharge status: Home / Self Care | Payer: Federal, State, Local not specified - PPO | Source: Ambulatory Visit

## 2020-03-28 ENCOUNTER — Other Ambulatory Visit: Payer: Self-pay

## 2020-03-28 VITALS — BP 130/85 | HR 85 | Temp 99.0°F | Resp 16

## 2020-03-28 DIAGNOSIS — J069 Acute upper respiratory infection, unspecified: Secondary | ICD-10-CM | POA: Diagnosis not present

## 2020-03-28 DIAGNOSIS — J029 Acute pharyngitis, unspecified: Secondary | ICD-10-CM

## 2020-03-28 MED ORDER — BENZONATATE 100 MG PO CAPS: 100.0000 mg | ORAL_CAPSULE | Freq: Three times a day (TID) | ORAL | 0 refills | Status: DC | PRN

## 2020-03-28 MED ORDER — CETIRIZINE-PSEUDOEPHEDRINE ER 5-120 MG PO TB12: 1.0000 | ORAL_TABLET | Freq: Every day | ORAL | 0 refills | Status: DC

## 2020-03-28 NOTE — Discharge Instructions (Signed)
The fact that you are triple vaccinated, along with the negative Covid test of your daughter, suggest that this is not Covid.  Your symptoms are not as severe as what we usually see with Covid.  Flu generally starts abruptly and there is a high fever, so I do not believe that this is a problem.  There is another virus going around the causes quite a bit of sinus congestion, cough, without fever  Continue your vitamins and medicine has been called in to help control your symptoms.

## 2020-03-28 NOTE — ED Provider Notes (Signed)
[facility]    CSN: [id number] Arrival date & time: 03/28/20  8841      History   Chief Complaint Chief Complaint  Patient presents with  . Appointment    10:00  . Sore Throat    HPI [name] is a 39 y.o. female.   This is an initial [place] urgent care patient visit.  Patient presents to Urgent Care with complaints of sore throat, headache, and runny nose since two days ago. Patient reports her daughter was sick recently too, negative for covid. Has been taking vitamins and tylenol for her sx.  Patient is triple vaccinated.  She is also had the flu vaccine a month ago.  Patient works at the [employer].  Her mother is visiting from [place].     Past Medical History:  Diagnosis Date  . Anemia   . Heart murmur   . Hemoglobin S-S disease (HCC)   . Infertility associated with anovulation   . Jaundice   . Ovarian cyst   . Sickle cell disease [facility])     Patient Active Problem List   Diagnosis Date Noted  . Missed abortion with fetal demise before 20 completed weeks of gestation 03/09/2017  . Sickle cell anemia with pain (HCC) 12/25/2015  . Left knee pain 12/15/2014  . Palpitations 02/16/2014  . Dyspnea 02/13/2014  . Other fatigue 02/09/2014  . Vitamin D insufficiency 02/09/2014  . Cyst of right breast 08/19/2013  . 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 Sickle cell anemia (HCC) 05/27/2012  . 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 CHOLECYSTECTOMY  2001    OB History    Gravida  2   Para  0   Term  0   Preterm  0   AB  2   Living  0     SAB  2   IAB  0   Ectopic  0   Multiple  0   Live Births               Home Medications    Prior to Admission medications   Medication Sig Start Date End Date Taking? Authorizing Provider  benzonatate (TESSALON) 100 MG capsule Take 1-2 capsules (100-200  mg total) by mouth 3 (three) times daily as needed for cough. 03/28/20   Elvina Sidle, MD  cetirizine-pseudoephedrine (ZYRTEC-D) 5-120 MG tablet Take 1 tablet by mouth daily. 03/28/20   Elvina Sidle, MD  cholecalciferol (VITAMIN D) 1000 units tablet Take 2,000 Units by mouth daily.    [provider]  folic acid (FOLVITE) 1 MG tablet TAKE 4 TABLETS (4 MG TOTAL) DAILY BY MOUTH. 11/07/17   Lesly Dukes, MD  ibuprofen (ADVIL,MOTRIN) 600 MG tablet Take 600 mg by mouth every 6 (six) hours as needed.    [provider]  levothyroxine (SYNTHROID, LEVOTHROID) 125 MCG tablet Take 125 mcg 3 x week Take 187.5 mcg 4 x week  ( pt takes a total of 9 tablets per week)    [provider]  oxyCODONE-acetaminophen (PERCOCET/ROXICET) 5-325 MG tablet Take by mouth as needed for severe pain.    [provider]  Prenatal Vit-Fe Fumarate-FA (PRENATAL MULTIVITAMIN) TABS tablet Take 1 tablet by mouth daily at 12 noon.    [provider]  vitamin C (ASCORBIC ACID) 250 MG tablet Take 250 mg daily by mouth.    [provider]  Family History Family History  Problem Relation Age of Onset  . Hypertension Mother   . Hypertension Father   . Cancer Paternal Uncle        stomach  . Cancer Sister        thyroid    Social History Social History   Tobacco Use  . Smoking status: Never Smoker  . Smokeless tobacco: Never Used  Vaping Use  . Vaping Use: Never used  Substance Use Topics  . Alcohol use: No    Alcohol/week: 0.0 standard drinks  . Drug use: No     Allergies   Patient has no known allergies.   Review of Systems Review of Systems  Constitutional: Negative.   HENT: Positive for congestion and sore throat.   Respiratory: Positive for cough. Negative for shortness of breath.      Physical Exam Triage Vital Signs ED Triage Vitals  Enc Vitals Group     BP      Pulse      Resp      Temp      Temp src      SpO2      Weight       Height      Head Circumference      Peak Flow      Pain Score      Pain Loc      Pain Edu?      Excl. in GC?    No data found.  Updated Vital Signs BP 130/85 (BP Location: Right Arm)   Pulse 85   Temp 99 F (37.2 C) (Oral)   Resp 16   SpO2 99%    Physical Exam Vitals and nursing note reviewed.  Constitutional:      General: She is not in acute distress.    Appearance: She is well-developed and normal weight. She is not ill-appearing.  HENT:     Head: Normocephalic.     Right Ear: Tympanic membrane normal.     Left Ear: Tympanic membrane normal.     Nose: Congestion present.     Mouth/Throat:     Mouth: Mucous membranes are moist.     Pharynx: Uvula midline. No oropharyngeal exudate or posterior oropharyngeal erythema.  Eyes:     Conjunctiva/sclera: Conjunctivae normal.  Cardiovascular:     Rate and Rhythm: Normal rate.     Heart sounds: Normal heart sounds.  Pulmonary:     Effort: Pulmonary effort is normal.     Breath sounds: Normal breath sounds.  Musculoskeletal:     Cervical back: Normal range of motion and neck supple.  Skin:    General: Skin is warm and dry.  Neurological:     General: No focal deficit present.     Mental Status: She is alert.  Psychiatric:        Mood and Affect: Mood normal.      UC Treatments / Results  Labs (all labs ordered are listed, but only abnormal results are displayed) Labs Reviewed - No data to display  EKG   Radiology No results found.  Procedures Procedures (including critical care time)  Medications Ordered in UC Medications - No data to display  Initial Impression / Assessment and Plan / UC Course  I have reviewed the triage vital signs and the nursing notes.  Pertinent labs & imaging results that were available during my care of the patient were reviewed by me and considered in my medical decision making (see chart for  details).    Final Clinical Impressions(s) / UC Diagnoses   Final diagnoses:   Viral upper respiratory tract infection     Discharge Instructions     The fact that you are triple vaccinated, along with the negative Covid test of your daughter, suggest that this is not Covid.  Your symptoms are not as severe as what we usually see with Covid.  Flu generally starts abruptly and there is a high fever, so I do not believe that this is a problem.  There is another virus going around the causes quite a bit of sinus congestion, cough, without fever  Continue your vitamins and medicine has been called in to help control your symptoms.    ED Prescriptions    Medication Sig Dispense Auth. Provider   benzonatate (TESSALON) 100 MG capsule Take 1-2 capsules (100-200 mg total) by mouth 3 (three) times daily as needed for cough. 40 capsule Elvina Sidle, MD   cetirizine-pseudoephedrine (ZYRTEC-D) 5-120 MG tablet Take 1 tablet by mouth daily. 30 tablet Elvina Sidle, MD     I have reviewed the PDMP during this encounter.   Elvina Sidle, MD 03/28/20 1021

## 2020-03-28 NOTE — ED Triage Notes (Signed)
Patient presents to Urgent Care with complaints of sore throat, headache, and runny nose since two days ago. Patient reports her daughter was sick recently too, negative for covid. Has been taking vitamins and tylenol for her sx.

## 2020-03-30 LAB — NOVEL CORONAVIRUS, NAA: SARS-CoV-2, NAA: NOT DETECTED

## 2020-03-30 LAB — SARS-COV-2, NAA 2 DAY TAT

## 2020-04-02 ENCOUNTER — Ambulatory Visit: Payer: Self-pay

## 2021-01-10 ENCOUNTER — Other Ambulatory Visit: Payer: Self-pay | Admitting: Obstetrics & Gynecology

## 2021-01-10 DIAGNOSIS — Z1231 Encounter for screening mammogram for malignant neoplasm of breast: Secondary | ICD-10-CM

## 2021-01-16 ENCOUNTER — Other Ambulatory Visit: Payer: Self-pay

## 2021-01-16 ENCOUNTER — Ambulatory Visit (INDEPENDENT_AMBULATORY_CARE_PROVIDER_SITE_OTHER): Payer: Federal, State, Local not specified - PPO

## 2021-01-16 DIAGNOSIS — Z1231 Encounter for screening mammogram for malignant neoplasm of breast: Secondary | ICD-10-CM

## 2021-04-26 ENCOUNTER — Other Ambulatory Visit: Payer: Self-pay

## 2021-04-26 ENCOUNTER — Emergency Department
Admission: RE | Admit: 2021-04-26 | Discharge: 2021-04-26 | Disposition: A | Discharge status: Home / Self Care | Payer: Federal, State, Local not specified - PPO | Source: Ambulatory Visit | Attending: Family Medicine | Admitting: Family Medicine

## 2021-04-26 VITALS — BP 116/76 | HR 89 | Temp 98.4°F | Resp 16 | Ht 61.0 in | Wt 160.0 lb

## 2021-04-26 DIAGNOSIS — J069 Acute upper respiratory infection, unspecified: Secondary | ICD-10-CM

## 2021-04-26 LAB — POC SARS CORONAVIRUS 2 AG -  ED: SARS Coronavirus 2 Ag: NEGATIVE

## 2021-04-26 LAB — POCT RAPID STREP A (OFFICE): Rapid Strep A Screen: NEGATIVE

## 2021-04-26 MED ORDER — DOXYCYCLINE HYCLATE 100 MG PO CAPS: ORAL_CAPSULE | ORAL | 0 refills | Status: AC

## 2021-04-26 NOTE — ED Provider Notes (Signed)
[facility]    CSN: [id number] Arrival date & time: 04/26/21  1000      History   Chief Complaint Chief Complaint  Patient presents with   Nasal Congestion   Cough    HPI [name] is a 41 y.o. female.   Patient complains of four day history of typical cold-like symptoms developing over several days, including mild sore throat, sinus congestion, headache, fatigue, and cough.  She denies fevers, chills, and sweats, pleuritic pain, and shortness of breath. She states that she has had several negative home COVID tests.  She has had flu and COVID immunizations. She has a history of sickle cell anemia.  The history is provided by the patient.   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 Authorizing Provider  doxycycline (VIBRAMYCIN) 100 MG capsule Take one cap PO Q12hr with food. 04/26/21  Yes Lattie Haw, MD  hydroxyurea (HYDREA) 500 MG capsule Take 1,500 mg by mouth daily. May take with food to minimize GI side effects.   Yes [provider]  cholecalciferol (VITAMIN D) 1000 units tablet Take 2,000 Units by mouth daily.    [provider]  folic acid (FOLVITE) 1 MG tablet TAKE 4 TABLETS (4 MG TOTAL) DAILY BY MOUTH. 11/07/17   Lesly Dukes, MD  levothyroxine (SYNTHROID, LEVOTHROID) 125 MCG tablet Take 125 mcg 3 x week Take 187.5 mcg 4 x week  ( pt takes a total of 9 tablets per week)    [provider]  vitamin C (ASCORBIC ACID) 250 MG tablet Take 250 mg daily by mouth.    [provider]    Family History Family History  Problem Relation Age of Onset   Hypertension Mother    Hypertension Father    Cancer Paternal Uncle        stomach   Cancer Sister        thyroid    Social History Social History   Tobacco Use   Smoking status: Never   Smokeless tobacco: Never  Vaping Use   Vaping Use:  Never used  Substance Use Topics   Alcohol use: No    Alcohol/week: 0.0 standard drinks   Drug use: No     Allergies   Patient has no known allergies.   Review of Systems Review of Systems + sore throat + cough No pleuritic pain No wheezing + nasal congestion + post-nasal drainage No sinus pain/pressure No itchy/red eyes No earache No hemoptysis No SOB No fever/chills No nausea No vomiting No abdominal pain No diarrhea No urinary symptoms No skin rash + fatigue + myalgias + headache Used OTC meds (Theraflu and Coricidin) without relief   Physical Exam Triage Vital Signs ED Triage Vitals  Enc Vitals Group     BP 04/26/21 1052 116/76     Pulse Rate 04/26/21 1052 89     Resp 04/26/21 1052 16     Temp 04/26/21 1052 98.4 F (36.9 C)     Temp Source 04/26/21 1052 Oral     SpO2 04/26/21 1052 97 %     Weight 04/26/21 1055 160 lb (72.6 kg)     Height 04/26/21 1055 5\' 1"  (1.549 m)     Head Circumference --      Peak  Flow --      Pain Score 04/26/21 1055 0     Pain Loc --      Pain Edu? --      Excl. in GC? --    No data found.  Updated Vital Signs BP 116/76 (BP Location: Right Arm)    Pulse 89    Temp 98.4 F (36.9 C) (Oral)    Resp 16    Ht 5\' 1"  (1.549 m)    Wt 72.6 kg    LMP 04/06/2021    SpO2 97%    BMI 30.23 kg/m   Visual Acuity Right Eye Distance:   Left Eye Distance:   Bilateral Distance:    Right Eye Near:   Left Eye Near:    Bilateral Near:     Physical Exam Nursing notes and Vital Signs reviewed. Appearance:  Patient appears stated age, and in no acute distress Eyes:  Pupils are equal, round, and reactive to light and accomodation.  Extraocular movement is intact.  Conjunctivae are not inflamed  Ears:  Canals normal.  Tympanic membranes normal.  Nose:  Mildly congested turbinates.  No sinus tenderness.  Pharynx:  Normal Neck:  Supple.  Mildly enlarged lateral nodes are present, tender to palpation on the left.   Lungs:  Clear to auscultation.  Breath sounds are equal.  Moving air well. Heart:  Regular rate and rhythm without murmurs, rubs, or gallops.  Abdomen:  Nontender without masses or hepatosplenomegaly.  Bowel sounds are present.  No CVA or flank tenderness.  Extremities:  No edema.  Skin:  No rash present.   UC Treatments / Results  Labs (all labs ordered are listed, but only abnormal results are displayed) Labs Reviewed  POC SARS CORONAVIRUS 2 AG -  ED  negative  POCT RAPID STREP A (OFFICE)  negative    EKG   Radiology No results found.  Procedures Procedures (including critical care time)  Medications Ordered in UC Medications - No data to display  Initial Impression / Assessment and Plan / UC Course  I have reviewed the triage vital signs and the nursing notes.  Pertinent labs & imaging results that were available during my care of the patient were reviewed by me and considered in my medical decision making (see chart for details).  Benign exam.   There is no evidence of bacterial infection today.  Treat symptomatically for now. Because of her history of sickle cell anemia will provide Rx for doxycycline to hold, and begin if symptoms begin to worsen. Followup with Family Doctor if not improved in 10 days.  Final Clinical Impressions(s) / UC Diagnoses   Final diagnoses:  Viral URI with cough     Discharge Instructions      Take plain guaifenesin (1200mg  extended release tabs such as Mucinex) twice daily, with plenty of water, for cough and congestion.  May add Pseudoephedrine (30mg , one or two every 4 to 6 hours) for sinus congestion.  Get adequate rest.   May use Afrin nasal spray (or generic oxymetazoline) each morning for about 5 days and then discontinue.  Also recommend using saline nasal spray several times daily and saline nasal irrigation (AYR is a common brand).   Try warm salt water gargles for sore throat.  Stop all antihistamines (Coricidin, etc) for now, and other non-prescription cough/cold preparations. May take Tylenol as needed body aches, headache, etc. Begin doxycycline if not improving about one week or if persistent fever develops (Given a prescription to hold, with an expiration date)        ED Prescriptions     Medication Sig Dispense Auth. Provider   doxycycline (VIBRAMYCIN) 100 MG capsule Take one cap PO Q12hr with food. 14 capsule , MD         , MD 04/26/21 [id number]

## 2021-04-26 NOTE — Discharge Instructions (Addendum)
Take plain guaifenesin (1200mg  extended release tabs such as Mucinex) twice daily, with plenty of water, for cough and congestion.  May add Pseudoephedrine (30mg , one or two every 4 to 6 hours) for sinus congestion.  Get adequate rest.   May use Afrin nasal spray (or generic oxymetazoline) each morning for about 5 days and then discontinue.  Also recommend using saline nasal spray several times daily and saline nasal irrigation (AYR is a common brand).   Try warm salt water gargles for sore throat.  Stop all antihistamines (Coricidin, etc) for now, and other non-prescription cough/cold preparations. May take Tylenol as needed body aches, headache, etc. Begin doxycycline if not improving about one week or if persistent fever develops

## 2021-04-26 NOTE — ED Triage Notes (Signed)
X4 days nasal congestion, sore throat and productive cough with green sputum. Afebrile. Taking otc theraflu, tylenol.
